# Patient Record
Sex: Female | Born: 1995
Health system: Southern US, Community
[De-identification: ages and names within clinical notes are randomized; demographics above are authoritative.]

## PROBLEM LIST (undated history)

## (undated) DIAGNOSIS — M249 Joint derangement, unspecified: Secondary | ICD-10-CM

## (undated) DIAGNOSIS — G43909 Migraine, unspecified, not intractable, without status migrainosus: Secondary | ICD-10-CM

## (undated) DIAGNOSIS — F419 Anxiety disorder, unspecified: Secondary | ICD-10-CM

## (undated) DIAGNOSIS — M797 Fibromyalgia: Secondary | ICD-10-CM

---
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
## 2016-02-13 DIAGNOSIS — H31011 Macula scars of posterior pole (postinflammatory) (post-traumatic), right eye: Secondary | ICD-10-CM | POA: Diagnosis not present

## 2016-02-27 DIAGNOSIS — J029 Acute pharyngitis, unspecified: Secondary | ICD-10-CM | POA: Diagnosis not present

## 2016-03-28 DIAGNOSIS — N926 Irregular menstruation, unspecified: Secondary | ICD-10-CM | POA: Diagnosis not present

## 2016-03-28 DIAGNOSIS — Z01419 Encounter for gynecological examination (general) (routine) without abnormal findings: Secondary | ICD-10-CM | POA: Diagnosis not present

## 2016-03-28 DIAGNOSIS — Z113 Encounter for screening for infections with a predominantly sexual mode of transmission: Secondary | ICD-10-CM | POA: Diagnosis not present

## 2016-03-28 DIAGNOSIS — Z6830 Body mass index (BMI) 30.0-30.9, adult: Secondary | ICD-10-CM | POA: Diagnosis not present

## 2016-04-10 DIAGNOSIS — R102 Pelvic and perineal pain: Secondary | ICD-10-CM | POA: Diagnosis not present

## 2016-04-10 DIAGNOSIS — N939 Abnormal uterine and vaginal bleeding, unspecified: Secondary | ICD-10-CM | POA: Diagnosis not present

## 2016-05-06 DIAGNOSIS — F411 Generalized anxiety disorder: Secondary | ICD-10-CM | POA: Diagnosis not present

## 2016-05-13 DIAGNOSIS — F411 Generalized anxiety disorder: Secondary | ICD-10-CM | POA: Diagnosis not present

## 2016-05-21 DIAGNOSIS — F411 Generalized anxiety disorder: Secondary | ICD-10-CM | POA: Diagnosis not present

## 2016-05-29 DIAGNOSIS — F411 Generalized anxiety disorder: Secondary | ICD-10-CM | POA: Diagnosis not present

## 2016-06-04 DIAGNOSIS — F411 Generalized anxiety disorder: Secondary | ICD-10-CM | POA: Diagnosis not present

## 2016-06-07 DIAGNOSIS — J310 Chronic rhinitis: Secondary | ICD-10-CM | POA: Diagnosis not present

## 2016-06-07 DIAGNOSIS — K219 Gastro-esophageal reflux disease without esophagitis: Secondary | ICD-10-CM | POA: Diagnosis not present

## 2016-06-07 DIAGNOSIS — R05 Cough: Secondary | ICD-10-CM | POA: Diagnosis not present

## 2016-06-11 DIAGNOSIS — F411 Generalized anxiety disorder: Secondary | ICD-10-CM | POA: Diagnosis not present

## 2016-06-17 DIAGNOSIS — F411 Generalized anxiety disorder: Secondary | ICD-10-CM | POA: Diagnosis not present

## 2016-06-24 DIAGNOSIS — F411 Generalized anxiety disorder: Secondary | ICD-10-CM | POA: Diagnosis not present

## 2016-07-02 DIAGNOSIS — F411 Generalized anxiety disorder: Secondary | ICD-10-CM | POA: Diagnosis not present

## 2016-07-03 DIAGNOSIS — F3281 Premenstrual dysphoric disorder: Secondary | ICD-10-CM | POA: Diagnosis not present

## 2016-07-03 DIAGNOSIS — F418 Other specified anxiety disorders: Secondary | ICD-10-CM | POA: Diagnosis not present

## 2016-07-08 DIAGNOSIS — F411 Generalized anxiety disorder: Secondary | ICD-10-CM | POA: Diagnosis not present

## 2016-07-15 DIAGNOSIS — F411 Generalized anxiety disorder: Secondary | ICD-10-CM | POA: Diagnosis not present

## 2016-07-22 DIAGNOSIS — F411 Generalized anxiety disorder: Secondary | ICD-10-CM | POA: Diagnosis not present

## 2016-07-29 DIAGNOSIS — F411 Generalized anxiety disorder: Secondary | ICD-10-CM | POA: Diagnosis not present

## 2016-08-05 DIAGNOSIS — F411 Generalized anxiety disorder: Secondary | ICD-10-CM | POA: Diagnosis not present

## 2016-08-12 DIAGNOSIS — F411 Generalized anxiety disorder: Secondary | ICD-10-CM | POA: Diagnosis not present

## 2016-08-14 DIAGNOSIS — F418 Other specified anxiety disorders: Secondary | ICD-10-CM | POA: Diagnosis not present

## 2016-08-19 DIAGNOSIS — F411 Generalized anxiety disorder: Secondary | ICD-10-CM | POA: Diagnosis not present

## 2016-08-28 DIAGNOSIS — K080 Exfoliation of teeth due to systemic causes: Secondary | ICD-10-CM | POA: Diagnosis not present

## 2016-09-02 DIAGNOSIS — F411 Generalized anxiety disorder: Secondary | ICD-10-CM | POA: Diagnosis not present

## 2016-09-16 DIAGNOSIS — F411 Generalized anxiety disorder: Secondary | ICD-10-CM | POA: Diagnosis not present

## 2016-09-23 DIAGNOSIS — F411 Generalized anxiety disorder: Secondary | ICD-10-CM | POA: Diagnosis not present

## 2016-09-30 DIAGNOSIS — F411 Generalized anxiety disorder: Secondary | ICD-10-CM | POA: Diagnosis not present

## 2016-10-07 DIAGNOSIS — F411 Generalized anxiety disorder: Secondary | ICD-10-CM | POA: Diagnosis not present

## 2016-10-14 DIAGNOSIS — F411 Generalized anxiety disorder: Secondary | ICD-10-CM | POA: Diagnosis not present

## 2016-10-25 DIAGNOSIS — F411 Generalized anxiety disorder: Secondary | ICD-10-CM | POA: Diagnosis not present

## 2016-11-04 DIAGNOSIS — F411 Generalized anxiety disorder: Secondary | ICD-10-CM | POA: Diagnosis not present

## 2016-11-11 DIAGNOSIS — F411 Generalized anxiety disorder: Secondary | ICD-10-CM | POA: Diagnosis not present

## 2016-11-18 DIAGNOSIS — F411 Generalized anxiety disorder: Secondary | ICD-10-CM | POA: Diagnosis not present

## 2016-11-26 DIAGNOSIS — F411 Generalized anxiety disorder: Secondary | ICD-10-CM | POA: Diagnosis not present

## 2016-12-02 DIAGNOSIS — F411 Generalized anxiety disorder: Secondary | ICD-10-CM | POA: Diagnosis not present

## 2016-12-09 DIAGNOSIS — F411 Generalized anxiety disorder: Secondary | ICD-10-CM | POA: Diagnosis not present

## 2016-12-16 DIAGNOSIS — F411 Generalized anxiety disorder: Secondary | ICD-10-CM | POA: Diagnosis not present

## 2016-12-23 DIAGNOSIS — F411 Generalized anxiety disorder: Secondary | ICD-10-CM | POA: Diagnosis not present

## 2016-12-30 DIAGNOSIS — F411 Generalized anxiety disorder: Secondary | ICD-10-CM | POA: Diagnosis not present

## 2017-01-13 DIAGNOSIS — F411 Generalized anxiety disorder: Secondary | ICD-10-CM | POA: Diagnosis not present

## 2017-01-20 DIAGNOSIS — F411 Generalized anxiety disorder: Secondary | ICD-10-CM | POA: Diagnosis not present

## 2017-01-27 DIAGNOSIS — F411 Generalized anxiety disorder: Secondary | ICD-10-CM | POA: Diagnosis not present

## 2017-02-03 DIAGNOSIS — F411 Generalized anxiety disorder: Secondary | ICD-10-CM | POA: Diagnosis not present

## 2017-02-17 DIAGNOSIS — F411 Generalized anxiety disorder: Secondary | ICD-10-CM | POA: Diagnosis not present

## 2017-02-24 DIAGNOSIS — F411 Generalized anxiety disorder: Secondary | ICD-10-CM | POA: Diagnosis not present

## 2017-03-10 DIAGNOSIS — F411 Generalized anxiety disorder: Secondary | ICD-10-CM | POA: Diagnosis not present

## 2017-03-17 DIAGNOSIS — F411 Generalized anxiety disorder: Secondary | ICD-10-CM | POA: Diagnosis not present

## 2017-03-31 DIAGNOSIS — F411 Generalized anxiety disorder: Secondary | ICD-10-CM | POA: Diagnosis not present

## 2017-04-07 DIAGNOSIS — F411 Generalized anxiety disorder: Secondary | ICD-10-CM | POA: Diagnosis not present

## 2017-04-09 DIAGNOSIS — K080 Exfoliation of teeth due to systemic causes: Secondary | ICD-10-CM | POA: Diagnosis not present

## 2017-04-28 DIAGNOSIS — F411 Generalized anxiety disorder: Secondary | ICD-10-CM | POA: Diagnosis not present

## 2017-05-01 DIAGNOSIS — J0100 Acute maxillary sinusitis, unspecified: Secondary | ICD-10-CM | POA: Diagnosis not present

## 2017-05-05 DIAGNOSIS — F411 Generalized anxiety disorder: Secondary | ICD-10-CM | POA: Diagnosis not present

## 2017-05-12 DIAGNOSIS — F411 Generalized anxiety disorder: Secondary | ICD-10-CM | POA: Diagnosis not present

## 2017-05-19 DIAGNOSIS — F411 Generalized anxiety disorder: Secondary | ICD-10-CM | POA: Diagnosis not present

## 2017-05-26 DIAGNOSIS — F411 Generalized anxiety disorder: Secondary | ICD-10-CM | POA: Diagnosis not present

## 2017-06-02 DIAGNOSIS — F411 Generalized anxiety disorder: Secondary | ICD-10-CM | POA: Diagnosis not present

## 2017-06-09 DIAGNOSIS — F411 Generalized anxiety disorder: Secondary | ICD-10-CM | POA: Diagnosis not present

## 2017-06-11 DIAGNOSIS — R05 Cough: Secondary | ICD-10-CM | POA: Diagnosis not present

## 2017-06-11 DIAGNOSIS — J310 Chronic rhinitis: Secondary | ICD-10-CM | POA: Diagnosis not present

## 2017-06-11 DIAGNOSIS — K219 Gastro-esophageal reflux disease without esophagitis: Secondary | ICD-10-CM | POA: Diagnosis not present

## 2017-06-16 DIAGNOSIS — F411 Generalized anxiety disorder: Secondary | ICD-10-CM | POA: Diagnosis not present

## 2017-06-23 DIAGNOSIS — F411 Generalized anxiety disorder: Secondary | ICD-10-CM | POA: Diagnosis not present

## 2017-07-02 DIAGNOSIS — F411 Generalized anxiety disorder: Secondary | ICD-10-CM | POA: Diagnosis not present

## 2017-07-14 DIAGNOSIS — F411 Generalized anxiety disorder: Secondary | ICD-10-CM | POA: Diagnosis not present

## 2017-07-21 DIAGNOSIS — F411 Generalized anxiety disorder: Secondary | ICD-10-CM | POA: Diagnosis not present

## 2017-07-28 DIAGNOSIS — F411 Generalized anxiety disorder: Secondary | ICD-10-CM | POA: Diagnosis not present

## 2017-07-31 DIAGNOSIS — Z01419 Encounter for gynecological examination (general) (routine) without abnormal findings: Secondary | ICD-10-CM | POA: Diagnosis not present

## 2017-07-31 DIAGNOSIS — Z6830 Body mass index (BMI) 30.0-30.9, adult: Secondary | ICD-10-CM | POA: Diagnosis not present

## 2017-07-31 DIAGNOSIS — Z113 Encounter for screening for infections with a predominantly sexual mode of transmission: Secondary | ICD-10-CM | POA: Diagnosis not present

## 2017-08-04 DIAGNOSIS — F411 Generalized anxiety disorder: Secondary | ICD-10-CM | POA: Diagnosis not present

## 2017-08-11 DIAGNOSIS — F411 Generalized anxiety disorder: Secondary | ICD-10-CM | POA: Diagnosis not present

## 2017-08-18 DIAGNOSIS — F411 Generalized anxiety disorder: Secondary | ICD-10-CM | POA: Diagnosis not present

## 2017-09-03 DIAGNOSIS — F411 Generalized anxiety disorder: Secondary | ICD-10-CM | POA: Diagnosis not present

## 2017-09-08 DIAGNOSIS — F411 Generalized anxiety disorder: Secondary | ICD-10-CM | POA: Diagnosis not present

## 2017-09-15 DIAGNOSIS — F411 Generalized anxiety disorder: Secondary | ICD-10-CM | POA: Diagnosis not present

## 2017-09-22 DIAGNOSIS — F411 Generalized anxiety disorder: Secondary | ICD-10-CM | POA: Diagnosis not present

## 2017-09-29 DIAGNOSIS — F411 Generalized anxiety disorder: Secondary | ICD-10-CM | POA: Diagnosis not present

## 2017-10-06 DIAGNOSIS — F411 Generalized anxiety disorder: Secondary | ICD-10-CM | POA: Diagnosis not present

## 2017-10-13 DIAGNOSIS — F411 Generalized anxiety disorder: Secondary | ICD-10-CM | POA: Diagnosis not present

## 2017-10-29 DIAGNOSIS — F411 Generalized anxiety disorder: Secondary | ICD-10-CM | POA: Diagnosis not present

## 2017-11-03 DIAGNOSIS — F411 Generalized anxiety disorder: Secondary | ICD-10-CM | POA: Diagnosis not present

## 2017-11-10 DIAGNOSIS — F411 Generalized anxiety disorder: Secondary | ICD-10-CM | POA: Diagnosis not present

## 2017-11-17 DIAGNOSIS — F411 Generalized anxiety disorder: Secondary | ICD-10-CM | POA: Diagnosis not present

## 2017-11-24 DIAGNOSIS — F411 Generalized anxiety disorder: Secondary | ICD-10-CM | POA: Diagnosis not present

## 2017-12-01 DIAGNOSIS — F411 Generalized anxiety disorder: Secondary | ICD-10-CM | POA: Diagnosis not present

## 2017-12-08 DIAGNOSIS — F411 Generalized anxiety disorder: Secondary | ICD-10-CM | POA: Diagnosis not present

## 2017-12-15 DIAGNOSIS — F411 Generalized anxiety disorder: Secondary | ICD-10-CM | POA: Diagnosis not present

## 2017-12-22 DIAGNOSIS — F411 Generalized anxiety disorder: Secondary | ICD-10-CM | POA: Diagnosis not present

## 2017-12-29 DIAGNOSIS — F411 Generalized anxiety disorder: Secondary | ICD-10-CM | POA: Diagnosis not present

## 2018-01-05 DIAGNOSIS — F411 Generalized anxiety disorder: Secondary | ICD-10-CM | POA: Diagnosis not present

## 2018-01-12 DIAGNOSIS — F411 Generalized anxiety disorder: Secondary | ICD-10-CM | POA: Diagnosis not present

## 2018-01-19 DIAGNOSIS — F411 Generalized anxiety disorder: Secondary | ICD-10-CM | POA: Diagnosis not present

## 2018-02-02 DIAGNOSIS — F411 Generalized anxiety disorder: Secondary | ICD-10-CM | POA: Diagnosis not present

## 2018-02-09 DIAGNOSIS — F411 Generalized anxiety disorder: Secondary | ICD-10-CM | POA: Diagnosis not present

## 2018-02-16 DIAGNOSIS — F411 Generalized anxiety disorder: Secondary | ICD-10-CM | POA: Diagnosis not present

## 2018-02-23 DIAGNOSIS — F411 Generalized anxiety disorder: Secondary | ICD-10-CM | POA: Diagnosis not present

## 2018-03-09 DIAGNOSIS — F411 Generalized anxiety disorder: Secondary | ICD-10-CM | POA: Diagnosis not present

## 2018-03-30 DIAGNOSIS — F411 Generalized anxiety disorder: Secondary | ICD-10-CM | POA: Diagnosis not present

## 2018-04-06 DIAGNOSIS — F411 Generalized anxiety disorder: Secondary | ICD-10-CM | POA: Diagnosis not present

## 2018-04-13 DIAGNOSIS — F411 Generalized anxiety disorder: Secondary | ICD-10-CM | POA: Diagnosis not present

## 2018-04-20 DIAGNOSIS — F411 Generalized anxiety disorder: Secondary | ICD-10-CM | POA: Diagnosis not present

## 2018-04-27 DIAGNOSIS — F411 Generalized anxiety disorder: Secondary | ICD-10-CM | POA: Diagnosis not present

## 2018-05-11 DIAGNOSIS — F411 Generalized anxiety disorder: Secondary | ICD-10-CM | POA: Diagnosis not present

## 2018-05-18 DIAGNOSIS — F411 Generalized anxiety disorder: Secondary | ICD-10-CM | POA: Diagnosis not present

## 2018-05-25 DIAGNOSIS — F411 Generalized anxiety disorder: Secondary | ICD-10-CM | POA: Diagnosis not present

## 2018-06-08 DIAGNOSIS — F411 Generalized anxiety disorder: Secondary | ICD-10-CM | POA: Diagnosis not present

## 2018-06-10 DIAGNOSIS — D649 Anemia, unspecified: Secondary | ICD-10-CM | POA: Diagnosis not present

## 2018-06-10 DIAGNOSIS — N760 Acute vaginitis: Secondary | ICD-10-CM | POA: Diagnosis not present

## 2018-06-10 DIAGNOSIS — Z113 Encounter for screening for infections with a predominantly sexual mode of transmission: Secondary | ICD-10-CM | POA: Diagnosis not present

## 2018-06-16 DIAGNOSIS — F411 Generalized anxiety disorder: Secondary | ICD-10-CM | POA: Diagnosis not present

## 2018-07-02 DIAGNOSIS — F411 Generalized anxiety disorder: Secondary | ICD-10-CM | POA: Diagnosis not present

## 2018-07-13 DIAGNOSIS — F411 Generalized anxiety disorder: Secondary | ICD-10-CM | POA: Diagnosis not present

## 2018-07-30 DIAGNOSIS — F411 Generalized anxiety disorder: Secondary | ICD-10-CM | POA: Diagnosis not present

## 2018-08-17 DIAGNOSIS — N760 Acute vaginitis: Secondary | ICD-10-CM | POA: Diagnosis not present

## 2018-08-17 DIAGNOSIS — Z113 Encounter for screening for infections with a predominantly sexual mode of transmission: Secondary | ICD-10-CM | POA: Diagnosis not present

## 2018-09-08 DIAGNOSIS — Z113 Encounter for screening for infections with a predominantly sexual mode of transmission: Secondary | ICD-10-CM | POA: Diagnosis not present

## 2018-09-08 DIAGNOSIS — Z6824 Body mass index (BMI) 24.0-24.9, adult: Secondary | ICD-10-CM | POA: Diagnosis not present

## 2018-09-08 DIAGNOSIS — Z0000 Encounter for general adult medical examination without abnormal findings: Secondary | ICD-10-CM | POA: Diagnosis not present

## 2018-09-08 DIAGNOSIS — R87610 Atypical squamous cells of undetermined significance on cytologic smear of cervix (ASC-US): Secondary | ICD-10-CM | POA: Diagnosis not present

## 2018-09-08 DIAGNOSIS — Z1329 Encounter for screening for other suspected endocrine disorder: Secondary | ICD-10-CM | POA: Diagnosis not present

## 2018-09-08 DIAGNOSIS — Z01419 Encounter for gynecological examination (general) (routine) without abnormal findings: Secondary | ICD-10-CM | POA: Diagnosis not present

## 2018-10-19 DIAGNOSIS — M5489 Other dorsalgia: Secondary | ICD-10-CM | POA: Diagnosis not present

## 2018-10-19 DIAGNOSIS — S322XXA Fracture of coccyx, initial encounter for closed fracture: Secondary | ICD-10-CM | POA: Diagnosis not present

## 2018-11-10 DIAGNOSIS — S322XXD Fracture of coccyx, subsequent encounter for fracture with routine healing: Secondary | ICD-10-CM | POA: Diagnosis not present

## 2019-01-28 DIAGNOSIS — F411 Generalized anxiety disorder: Secondary | ICD-10-CM | POA: Diagnosis not present

## 2019-02-11 DIAGNOSIS — F411 Generalized anxiety disorder: Secondary | ICD-10-CM | POA: Diagnosis not present

## 2019-02-25 DIAGNOSIS — F411 Generalized anxiety disorder: Secondary | ICD-10-CM | POA: Diagnosis not present

## 2019-03-19 DIAGNOSIS — F411 Generalized anxiety disorder: Secondary | ICD-10-CM | POA: Diagnosis not present

## 2019-03-25 DIAGNOSIS — F411 Generalized anxiety disorder: Secondary | ICD-10-CM | POA: Diagnosis not present

## 2019-04-01 DIAGNOSIS — Z03818 Encounter for observation for suspected exposure to other biological agents ruled out: Secondary | ICD-10-CM | POA: Diagnosis not present

## 2019-04-08 DIAGNOSIS — F411 Generalized anxiety disorder: Secondary | ICD-10-CM | POA: Diagnosis not present

## 2019-04-22 DIAGNOSIS — F411 Generalized anxiety disorder: Secondary | ICD-10-CM | POA: Diagnosis not present

## 2019-05-06 DIAGNOSIS — F411 Generalized anxiety disorder: Secondary | ICD-10-CM | POA: Diagnosis not present

## 2019-05-19 DIAGNOSIS — F411 Generalized anxiety disorder: Secondary | ICD-10-CM | POA: Diagnosis not present

## 2019-06-02 DIAGNOSIS — F411 Generalized anxiety disorder: Secondary | ICD-10-CM | POA: Diagnosis not present

## 2019-06-16 DIAGNOSIS — F411 Generalized anxiety disorder: Secondary | ICD-10-CM | POA: Diagnosis not present

## 2019-06-21 DIAGNOSIS — H9193 Unspecified hearing loss, bilateral: Secondary | ICD-10-CM | POA: Diagnosis not present

## 2019-06-30 DIAGNOSIS — F411 Generalized anxiety disorder: Secondary | ICD-10-CM | POA: Diagnosis not present

## 2019-07-08 DIAGNOSIS — Z7289 Other problems related to lifestyle: Secondary | ICD-10-CM | POA: Diagnosis not present

## 2019-07-08 DIAGNOSIS — H93293 Other abnormal auditory perceptions, bilateral: Secondary | ICD-10-CM | POA: Diagnosis not present

## 2019-07-08 DIAGNOSIS — F1290 Cannabis use, unspecified, uncomplicated: Secondary | ICD-10-CM | POA: Diagnosis not present

## 2019-07-14 DIAGNOSIS — F411 Generalized anxiety disorder: Secondary | ICD-10-CM | POA: Diagnosis not present

## 2019-07-21 DIAGNOSIS — F411 Generalized anxiety disorder: Secondary | ICD-10-CM | POA: Diagnosis not present

## 2019-08-04 DIAGNOSIS — F411 Generalized anxiety disorder: Secondary | ICD-10-CM | POA: Diagnosis not present

## 2019-08-19 DIAGNOSIS — F411 Generalized anxiety disorder: Secondary | ICD-10-CM | POA: Diagnosis not present

## 2019-09-01 DIAGNOSIS — F411 Generalized anxiety disorder: Secondary | ICD-10-CM | POA: Diagnosis not present

## 2019-09-07 DIAGNOSIS — Z20828 Contact with and (suspected) exposure to other viral communicable diseases: Secondary | ICD-10-CM | POA: Diagnosis not present

## 2019-09-13 DIAGNOSIS — Z124 Encounter for screening for malignant neoplasm of cervix: Secondary | ICD-10-CM | POA: Diagnosis not present

## 2019-09-13 DIAGNOSIS — Z131 Encounter for screening for diabetes mellitus: Secondary | ICD-10-CM | POA: Diagnosis not present

## 2019-09-13 DIAGNOSIS — Z13220 Encounter for screening for lipoid disorders: Secondary | ICD-10-CM | POA: Diagnosis not present

## 2019-09-13 DIAGNOSIS — Z0000 Encounter for general adult medical examination without abnormal findings: Secondary | ICD-10-CM | POA: Diagnosis not present

## 2019-09-15 DIAGNOSIS — F411 Generalized anxiety disorder: Secondary | ICD-10-CM | POA: Diagnosis not present

## 2019-09-20 ENCOUNTER — Encounter (HOSPITAL_COMMUNITY): Payer: Self-pay | Admitting: Emergency Medicine

## 2019-09-20 ENCOUNTER — Emergency Department (HOSPITAL_COMMUNITY)
Admission: EM | Admit: 2019-09-20 | Discharge: 2019-09-20 | Disposition: A | Discharge status: Home / Self Care | Payer: Federal, State, Local not specified - PPO | Attending: Emergency Medicine | Admitting: Emergency Medicine

## 2019-09-20 ENCOUNTER — Emergency Department (HOSPITAL_COMMUNITY): Payer: Federal, State, Local not specified - PPO

## 2019-09-20 ENCOUNTER — Other Ambulatory Visit: Payer: Self-pay

## 2019-09-20 DIAGNOSIS — K5904 Chronic idiopathic constipation: Secondary | ICD-10-CM | POA: Diagnosis not present

## 2019-09-20 DIAGNOSIS — N83209 Unspecified ovarian cyst, unspecified side: Secondary | ICD-10-CM

## 2019-09-20 DIAGNOSIS — A749 Chlamydial infection, unspecified: Secondary | ICD-10-CM | POA: Insufficient documentation

## 2019-09-20 DIAGNOSIS — N83201 Unspecified ovarian cyst, right side: Secondary | ICD-10-CM | POA: Insufficient documentation

## 2019-09-20 DIAGNOSIS — N83291 Other ovarian cyst, right side: Secondary | ICD-10-CM | POA: Diagnosis not present

## 2019-09-20 DIAGNOSIS — R1031 Right lower quadrant pain: Secondary | ICD-10-CM | POA: Diagnosis not present

## 2019-09-20 DIAGNOSIS — B9689 Other specified bacterial agents as the cause of diseases classified elsewhere: Secondary | ICD-10-CM | POA: Diagnosis not present

## 2019-09-20 DIAGNOSIS — N760 Acute vaginitis: Secondary | ICD-10-CM | POA: Diagnosis not present

## 2019-09-20 DIAGNOSIS — Z79899 Other long term (current) drug therapy: Secondary | ICD-10-CM | POA: Insufficient documentation

## 2019-09-20 LAB — CBC
HCT: 42.1 % (ref 36.0–46.0)
Hemoglobin: 14.5 g/dL (ref 12.0–15.0)
MCH: 31.9 pg (ref 26.0–34.0)
MCHC: 34.4 g/dL (ref 30.0–36.0)
MCV: 92.5 fL (ref 80.0–100.0)
Platelets: 241 10*3/uL (ref 150–400)
RBC: 4.55 MIL/uL (ref 3.87–5.11)
RDW: 12.1 % (ref 11.5–15.5)
WBC: 11.9 10*3/uL — ABNORMAL HIGH (ref 4.0–10.5)
nRBC: 0 % (ref 0.0–0.2)

## 2019-09-20 LAB — URINALYSIS, ROUTINE W REFLEX MICROSCOPIC
Bilirubin Urine: NEGATIVE
Glucose, UA: NEGATIVE mg/dL
Hgb urine dipstick: NEGATIVE
Ketones, ur: NEGATIVE mg/dL
Leukocytes,Ua: NEGATIVE
Nitrite: NEGATIVE
Protein, ur: NEGATIVE mg/dL
Specific Gravity, Urine: 1.015 (ref 1.005–1.030)
pH: 7 (ref 5.0–8.0)

## 2019-09-20 LAB — WET PREP, GENITAL
Sperm: NONE SEEN
Trich, Wet Prep: NONE SEEN
Yeast Wet Prep HPF POC: NONE SEEN

## 2019-09-20 LAB — COMPREHENSIVE METABOLIC PANEL
ALT: 11 U/L (ref 0–44)
AST: 13 U/L — ABNORMAL LOW (ref 15–41)
Albumin: 4.4 g/dL (ref 3.5–5.0)
Alkaline Phosphatase: 43 U/L (ref 38–126)
Anion gap: 8 (ref 5–15)
BUN: 8 mg/dL (ref 6–20)
CO2: 24 mmol/L (ref 22–32)
Calcium: 9.5 mg/dL (ref 8.9–10.3)
Chloride: 105 mmol/L (ref 98–111)
Creatinine, Ser: 0.84 mg/dL (ref 0.44–1.00)
GFR calc Af Amer: >60 mL/min (ref >60)
GFR calc non Af Amer: >60 mL/min (ref >60)
Glucose, Bld: 104 mg/dL — ABNORMAL HIGH (ref 70–99)
Potassium: 3.6 mmol/L (ref 3.5–5.1)
Sodium: 137 mmol/L (ref 135–145)
Total Bilirubin: 0.7 mg/dL (ref 0.3–1.2)
Total Protein: 7.5 g/dL (ref 6.5–8.1)

## 2019-09-20 LAB — I-STAT BETA HCG BLOOD, ED (MC, WL, AP ONLY): I-stat hCG, quantitative: <5 m[IU]/mL (ref <5)

## 2019-09-20 LAB — LIPASE, BLOOD: Lipase: 29 U/L (ref 11–51)

## 2019-09-20 MED ORDER — NAPROXEN 500 MG PO TABS: 500.0000 mg | ORAL_TABLET | Freq: Three times a day (TID) | ORAL | 0 refills | Status: AC

## 2019-09-20 MED ORDER — SODIUM CHLORIDE 0.9 % IV SOLN
Freq: Once | INTRAVENOUS | Status: AC
  Administered 2019-09-20: 18:00:00 via INTRAVENOUS

## 2019-09-20 MED ORDER — ONDANSETRON HCL 4 MG/2ML IJ SOLN
4.0000 mg | Freq: Once | INTRAMUSCULAR | Status: AC
  Administered 2019-09-20: 17:00:00 4 mg via INTRAVENOUS
  Filled 2019-09-20: qty 2

## 2019-09-20 MED ORDER — SODIUM CHLORIDE 0.9% FLUSH
3.0000 mL | Freq: Once | INTRAVENOUS | Status: AC
  Administered 2019-09-20: 3 mL via INTRAVENOUS

## 2019-09-20 MED ORDER — IOHEXOL 300 MG/ML  SOLN
100.0000 mL | Freq: Once | INTRAMUSCULAR | Status: AC | PRN
  Administered 2019-09-20: 100 mL via INTRAVENOUS

## 2019-09-20 MED ORDER — ONDANSETRON HCL 4 MG/2ML IJ SOLN
4.0000 mg | Freq: Once | INTRAMUSCULAR | Status: AC
  Administered 2019-09-20: 4 mg via INTRAVENOUS
  Filled 2019-09-20: qty 2

## 2019-09-20 MED ORDER — METRONIDAZOLE 500 MG PO TABS: 500.0000 mg | ORAL_TABLET | Freq: Two times a day (BID) | ORAL | 0 refills | Status: DC

## 2019-09-20 MED ORDER — MORPHINE SULFATE (PF) 4 MG/ML IV SOLN
4.0000 mg | Freq: Once | INTRAVENOUS | Status: AC
  Administered 2019-09-20: 4 mg via INTRAVENOUS
  Filled 2019-09-20: qty 1

## 2019-09-20 MED ORDER — ONDANSETRON 4 MG PO TBDP: 4.0000 mg | ORAL_TABLET | Freq: Three times a day (TID) | ORAL | 0 refills | Status: DC | PRN

## 2019-09-20 MED ORDER — KETOROLAC TROMETHAMINE 30 MG/ML IJ SOLN
30.0000 mg | Freq: Once | INTRAMUSCULAR | Status: AC
  Administered 2019-09-20: 30 mg via INTRAVENOUS
  Filled 2019-09-20: qty 1

## 2019-09-20 MED ORDER — OXYCODONE-ACETAMINOPHEN 5-325 MG PO TABS
1.0000 | ORAL_TABLET | Freq: Once | ORAL | Status: AC
  Administered 2019-09-20: 1 via ORAL
  Filled 2019-09-20: qty 1

## 2019-09-20 NOTE — Discharge Instructions (Signed)
You were seen in the ER for abdominal pain  Pelvic exam was done and swabs were collected to check for pelvic infection.  Swabs showed bacterial vaginosis which can cause odor and discharge, take metronidazole for this.  This medicine can cause nausea, so use ondansetron as needed for nausea. Do not drink alcohol with this medicine as it will cause severe nausea, vomiting, abdominal pain. Remaining testing for gonorrhea, chlamydia is pending.    CT showed diverticulosis but no active diverticulitis.  It also showed a small 2.6 cm right ovary cyst with fluid around it likely from rupture.  This is likely causing your pain.    Alternate naproxen 500 mg every 8 hours for pain.  Can add [phone] mg acetaminophen every 6-8 hours for more pain control.  Pain due to inflammation will ease off in the next few days.  Miralax or stool softener, increase fiber and oral hydration for constipation that can also contribute to lower abdominal pain  Return for worsening pain despite medicines, abdominal bloating or swelling, heavy vaginal bleeding or discharge, fever

## 2019-09-20 NOTE — ED Triage Notes (Signed)
Pt reports RLQ pain x3 days, worse with radiation, denies vomiting or diarrhea, states she has been constipated. Also reports pain radiates down R leg. Pt still has her appendix.

## 2019-09-20 NOTE — ED Notes (Signed)
Patient verbalizes understanding of discharge instructions. Opportunity for questioning and answers were provided. Armband removed by staff, pt discharged from ED ambulatory to home.  

## 2019-09-20 NOTE — ED Provider Notes (Signed)
[facility]   CSN: [id number] Arrival date & time: 09/20/19  1439     History   Chief Complaint Chief Complaint  Patient presents with   Abdominal Pain    HPI Denise Howard is a 23 y.o. female with history of constipation, uterine fibroids presents to the ER for evaluation of abdominal pain.  Onset a few days ago that acutely worsened last night.  Described as lower, cramping, intermittent.  Pain radiates from RLQ across lower abdomen and low back. Initially thought it was ovulation pain or constipation because in the past she has had premenstrual cramping and issues with constipation that have caused similar pain.  She took Midol without significant relief.  Pain is worse with walking, certain position changes, eating.  When the pain is severe she feels pain radiating down the front of both legs R worse than L like her legs are going numb.  Yesterday she felt the pain was severe enough that she almost came to the ER but had a bowel movement and felt almost completely better, she felt well for the next few hours but this morning when she went to work the pain began around noon.  She was not able to eat.  Her last BM was yesterday morning, still passing gas now.  Has mild associated nausea but states she is always had morning time nausea her entire life states she is not a morning person.  Has noticed slight change in odor in her vaginal discharge.  She is sexually active with female partners only with inconsistent condom use.  Her last menstrual period was November 7.  A couple of months ago she was sexually active with a female partner and took Plan B which is caused irregular vaginal spotting.  She had scant amount of brown-red vaginal bleeding after her last menses and after having sex recently.  No history of pelvic infections or STDs.  She is not concerned about an STD currently.  Denies fever, vomiting, dysuria, hematuria.  States in the  past she used to have a lot of lower abdominal pain and had to go to the ER but states they were unable to determine the cause.  No history of abdominal surgeries.  She does not use birth control.  No history of kidney stones.    HPI  Past Medical History:  Diagnosis Date   Anxiety    Fibromyalgia    Hypermobile joints    Migraines     There are no active problems to display for this patient.   History reviewed. No pertinent surgical history.   OB History   No obstetric history on file.      Home Medications    Prior to Admission medications   Medication Sig Start Date End Date Taking? Authorizing Provider  polyethylene glycol (MIRALAX / GLYCOLAX) 17 g packet Take 17 g by mouth daily.   Yes [provider]  ALPRAZolam Prudy Feeler(XANAX) 0.5 MG tablet Take 0.5 mg by mouth daily as needed for anxiety.  09/13/19   [provider]  hydrOXYzine (ATARAX/VISTARIL) 25 MG tablet Take 25 mg by mouth as needed for anxiety.     [provider]  metroNIDAZOLE (FLAGYL) 500 MG tablet Take 1 tablet (500 mg total) by mouth 2 (two) times daily. 09/20/19   Liberty HandyGibbons, Venezia Sargeant J, PA-C  naproxen (NAPROSYN) 500 MG tablet Take 1 tablet (500 mg total) by mouth 3 (three) times daily with meals for 7 days. 09/20/19 09/27/19  Liberty Handy, PA-C  ondansetron (ZOFRAN ODT) 4 MG disintegrating tablet Take 1 tablet (4 mg total) by mouth every 8 (eight) hours as needed for nausea or vomiting. 09/20/19   Liberty Handy, PA-C    Family History No family history on file.  Social History Social History   Tobacco Use   Smoking status: Never Smoker  Substance Use Topics   Alcohol use: No   Drug use: No     Allergies   Patient has no known allergies.   Review of Systems Review of Systems  Gastrointestinal: Positive for abdominal pain, constipation and nausea (chronic).  Genitourinary:       Vaginal odor  Musculoskeletal: Positive for back pain.  All other systems  reviewed and are negative.    Physical Exam Updated Vital Signs BP 103/73    Pulse 92    Temp 98.4 F (36.9 C) (Oral)    Resp 16    LMP 09/04/2019 (Exact Date)    SpO2 100%   Physical Exam Vitals signs and nursing note reviewed.  Constitutional:      Appearance: She is well-developed.     Comments: Non toxic in NAD  HENT:     Head: Normocephalic and atraumatic.     Nose: Nose normal.  Eyes:     Conjunctiva/sclera: Conjunctivae normal.  Neck:     Musculoskeletal: Normal range of motion.  Cardiovascular:     Rate and Rhythm: Normal rate and regular rhythm.  Pulmonary:     Effort: Pulmonary effort is normal.     Breath sounds: Normal breath sounds.  Abdominal:     General: Bowel sounds are normal.     Palpations: Abdomen is soft.     Tenderness: There is abdominal tenderness in the right lower quadrant, suprapubic area and left lower quadrant. Positive signs include McBurney's sign and psoas sign.     Comments: Diffuse lower abdominal pain worse in RLQ and LLQ.  Some, milder suprapubic tenderness.  Negative obturator signs.  No G/R/R. No CVA tenderness. Negative Murphy's. Active BS to lower quadrants. Laying flat and walking standing straight up makes pain worse.   Genitourinary:    Vagina: Vaginal discharge and tenderness present.     Comments: Exam performed with EMT at bedside for assistance. External genitalia without lesions.  No groin lymphadenopathy.  Vaginal mucosa and cervix pink without lesions, friability, polyps, bleeding. Cervical os visualized, closed.  Moderate amount white discharge in vaginal vault noted.  No CMT.  Nonpalpable, nontender adnexa.  Tenderness with palpation of LLQ and RLQ. Perianal skin normal without lesions.  Musculoskeletal: Normal range of motion.  Skin:    General: Skin is warm and dry.     Capillary Refill: Capillary refill takes less than 2 seconds.  Neurological:     Mental Status: She is alert.  Psychiatric:        Behavior: Behavior  normal.      ED Treatments / Results  Labs (all labs ordered are listed, but only abnormal results are displayed) Labs Reviewed  WET PREP, GENITAL - Abnormal; Notable for the following components:      Result Value   Clue Cells Wet Prep HPF POC PRESENT (*)    WBC, Wet Prep HPF POC MANY (*)    All other components within normal limits  COMPREHENSIVE METABOLIC PANEL - Abnormal; Notable for the following components:   Glucose, Bld 104 (*)    AST 13 (*)    All other components within normal limits  CBC - Abnormal; Notable for the following components:   WBC 11.9 (*)    All other components within normal limits  URINE CULTURE  LIPASE, BLOOD  URINALYSIS, ROUTINE W REFLEX MICROSCOPIC  I-STAT BETA HCG BLOOD, ED ([facility], [facility], [facility] ONLY)  GC/CHLAMYDIA PROBE AMP ([place]) NOT AT [facility]    EKG None  Radiology Ct Abdomen Pelvis W Contrast  Result Date: 09/20/2019 CLINICAL DATA:  Right lower quadrant pain for several days EXAM: CT ABDOMEN AND PELVIS WITH CONTRAST TECHNIQUE: Multidetector CT imaging of the abdomen and pelvis was performed using the standard protocol following bolus administration of intravenous contrast. CONTRAST:  OMNIPAQUE IOHEXOL 300 MG/ML  SOLN COMPARISON:  01/12/2013 FINDINGS: Lower chest: No acute abnormality. Hepatobiliary: No focal liver abnormality is seen. No gallstones, gallbladder wall thickening, or biliary dilatation. Pancreas: Unremarkable. No pancreatic ductal dilatation or surrounding inflammatory changes. Spleen: Normal in size without focal abnormality. Adrenals/Urinary Tract: Adrenal glands are within normal limits. Kidneys are well visualized bilaterally. Normal enhancement pattern is seen. No renal calculi or obstructive changes are noted. Bladder is partially distended. Stomach/Bowel: Scattered diverticular change of the colon is seen. No findings to suggest diverticulitis are noted. The appendix is well visualized and within normal limits. No inflammatory  changes are seen. No small bowel obstructive changes are seen. The stomach is within normal limits. Vascular/Lymphatic: No significant vascular findings are present. No enlarged abdominal or pelvic lymph nodes. Circumaortic left renal vein is noted. Reproductive: 2.6 cm right ovarian cyst is noted. No uterine abnormality is seen. Other: Mild free fluid is noted which may be related to a ruptured cyst. Musculoskeletal: No acute or significant osseous findings. IMPRESSION: Mild diverticulosis without diverticulitis. Normal-appearing appendix. 2.6 cm right ovarian cyst with evidence of mild free fluid. A recently ruptured cyst may account for these changes. Electronically Signed   By: Alcide Clever M.D.   On: 09/20/2019 20:46    Procedures Procedures (including critical care time)  Medications Ordered in ED Medications  sodium chloride flush (NS) 0.9 % injection 3 mL (3 mLs Intravenous Given 09/20/19 1722)  ondansetron (ZOFRAN) injection 4 mg (4 mg Intravenous Given 09/20/19 1659)  0.9 %  sodium chloride infusion ( Intravenous New Bag/Given 09/20/19 1810)  morphine 4 MG/ML injection 4 mg (4 mg Intravenous Given 09/20/19 1700)  iohexol (OMNIPAQUE) 300 MG/ML solution 100 mL (100 mLs Intravenous Contrast Given 09/20/19 1924)  ketorolac (TORADOL) 30 MG/ML injection 30 mg (30 mg Intravenous Given 09/20/19 2155)  oxyCODONE-acetaminophen (PERCOCET/ROXICET) 5-325 MG per tablet 1 tablet (1 tablet Oral Given 09/20/19 2158)  ondansetron (ZOFRAN) injection 4 mg (4 mg Intravenous Given 09/20/19 2155)     Initial Impression / Assessment and Plan / ED Course  I have reviewed the triage vital signs and the nursing notes.  Pertinent labs & imaging results that were available during my care of the patient were reviewed by me and considered in my medical decision making (see chart for details).      EMR reviewed to obtain pertinent PMH.   Differential diagnosis is broad for this patient.  She is sexually active  with inconsistent condom use, takes Plan B and is having intermittent vaginal spotting, vaginal odor.  STD, PID is on differential.  No fever, chills or significant leukocytosis and TOA is less likely.  We will plan for a pelvic exam.  She has no UTI symptoms but has lower abdominal pain, will obtain a UA.  GI process like constipation is highly likely as she has had  issues with this and has had recent decreased BMs, given location however appendicitis is also a possibility.  I doubt SBO, diverticulitis.  Your work-up initiated in triage is essentially reassuring, leukocytosis WC 11.9.  LFTs, lipase normal.  Negative pregnancy test which makes ectopic pregnancy unlikely.  UA pending.  Pelvic pending.  IVF, morphine, Zofran.  2226:  Pelvic as above, no CMT. No adnexal fullness or tenderness but tenderness with abdominal palpation RLQ/LLQ, moderate discharge.  Proceeded with CT given overall reassuring pelvic exam.  CTAP shows right likely ruptured ovarian cyst, normal appendix, diverticulosis.  +Clue cells on wet prep.   Pt re-evaluated, continued pain but milder.  IV toradol, percocet, zofran.   Discussed findings with patient and mother.    Will dc with naproxen, tylenol, flagyl and zofran for BV and ruptured ovarian cyst.  Recommended daily bowel regimen/miralax for chronic constipation.  Return precautions given. They are agreeable with tx and disposition.  Final Clinical Impressions(s) / ED Diagnoses   Final diagnoses:  Ovarian cyst rupture  Bacterial vaginosis  Chronic idiopathic constipation    ED Discharge Orders         Ordered    metroNIDAZOLE (FLAGYL) 500 MG tablet  2 times daily     09/20/19 2217    ondansetron (ZOFRAN ODT) 4 MG disintegrating tablet  Every 8 hours PRN     09/20/19 2217    naproxen (NAPROSYN) 500 MG tablet  3 times daily with meals     09/20/19 2217           Kinnie Feil, PA-C 09/20/19 2229    Little, Wenda Overland, MD 09/21/19 [id number]

## 2019-09-21 LAB — URINE CULTURE: Culture: <10000 — AB

## 2019-09-22 ENCOUNTER — Telehealth: Payer: Self-pay | Admitting: Student

## 2019-09-22 DIAGNOSIS — A749 Chlamydial infection, unspecified: Secondary | ICD-10-CM

## 2019-09-22 LAB — GC/CHLAMYDIA PROBE AMP (~~LOC~~) NOT AT ARMC
Chlamydia: POSITIVE — AB
Neisseria Gonorrhea: NEGATIVE

## 2019-09-22 MED ORDER — AZITHROMYCIN 500 MG PO TABS: 1000.0000 mg | ORAL_TABLET | Freq: Once | ORAL | 0 refills | Status: AC

## 2019-09-22 NOTE — Telephone Encounter (Addendum)
Denise Howard tested positive for  Chlamydia. Patient was called by RN and allergies and pharmacy confirmed. Rx sent to pharmacy of choice.   Jorje Guild, NP 09/22/2019 1:37 PM       ----- Message from Bjorn Loser, RN sent at 09/22/2019 12:24 PM EST ----- This patient tested positive for :  Chlamydia  She"has NKDA",I have informed the patient of her results and confirmed her pharmacy is correct in her chart. Please send Rx.   Thank you,   Bjorn Loser, RN   Results faxed to [facility] Department.

## 2019-09-22 NOTE — Telephone Encounter (Signed)
-----   Message from Bjorn Loser, RN sent at 09/22/2019 12:24 PM EST ----- This patient tested positive for :  Chlamydia  She"has NKDA",I have informed the patient of her results and confirmed her pharmacy is correct in her chart. Please send Rx.   Thank you,   Bjorn Loser, RN   Results faxed to [facility] Department.

## 2019-09-29 DIAGNOSIS — F411 Generalized anxiety disorder: Secondary | ICD-10-CM | POA: Diagnosis not present

## 2019-10-05 DIAGNOSIS — R102 Pelvic and perineal pain: Secondary | ICD-10-CM | POA: Diagnosis not present

## 2019-10-05 DIAGNOSIS — A749 Chlamydial infection, unspecified: Secondary | ICD-10-CM | POA: Diagnosis not present

## 2019-10-05 DIAGNOSIS — N83209 Unspecified ovarian cyst, unspecified side: Secondary | ICD-10-CM | POA: Diagnosis not present

## 2019-10-13 DIAGNOSIS — F411 Generalized anxiety disorder: Secondary | ICD-10-CM | POA: Diagnosis not present

## 2019-10-27 DIAGNOSIS — F411 Generalized anxiety disorder: Secondary | ICD-10-CM | POA: Diagnosis not present

## 2019-11-02 DIAGNOSIS — N83209 Unspecified ovarian cyst, unspecified side: Secondary | ICD-10-CM | POA: Diagnosis not present

## 2019-11-24 DIAGNOSIS — F411 Generalized anxiety disorder: Secondary | ICD-10-CM | POA: Diagnosis not present

## 2019-12-01 DIAGNOSIS — F411 Generalized anxiety disorder: Secondary | ICD-10-CM | POA: Diagnosis not present

## 2019-12-08 DIAGNOSIS — F411 Generalized anxiety disorder: Secondary | ICD-10-CM | POA: Diagnosis not present

## 2019-12-22 DIAGNOSIS — F411 Generalized anxiety disorder: Secondary | ICD-10-CM | POA: Diagnosis not present

## 2020-01-05 DIAGNOSIS — F411 Generalized anxiety disorder: Secondary | ICD-10-CM | POA: Diagnosis not present

## 2020-01-19 DIAGNOSIS — F411 Generalized anxiety disorder: Secondary | ICD-10-CM | POA: Diagnosis not present

## 2020-02-02 DIAGNOSIS — F411 Generalized anxiety disorder: Secondary | ICD-10-CM | POA: Diagnosis not present

## 2020-02-09 DIAGNOSIS — F411 Generalized anxiety disorder: Secondary | ICD-10-CM | POA: Diagnosis not present

## 2020-02-16 DIAGNOSIS — F411 Generalized anxiety disorder: Secondary | ICD-10-CM | POA: Diagnosis not present

## 2020-02-23 DIAGNOSIS — F411 Generalized anxiety disorder: Secondary | ICD-10-CM | POA: Diagnosis not present

## 2020-03-01 DIAGNOSIS — F411 Generalized anxiety disorder: Secondary | ICD-10-CM | POA: Diagnosis not present

## 2020-03-08 DIAGNOSIS — F418 Other specified anxiety disorders: Secondary | ICD-10-CM | POA: Diagnosis not present

## 2020-03-08 DIAGNOSIS — K589 Irritable bowel syndrome without diarrhea: Secondary | ICD-10-CM | POA: Diagnosis not present

## 2020-03-15 DIAGNOSIS — F411 Generalized anxiety disorder: Secondary | ICD-10-CM | POA: Diagnosis not present

## 2020-03-29 DIAGNOSIS — F411 Generalized anxiety disorder: Secondary | ICD-10-CM | POA: Diagnosis not present

## 2020-04-12 DIAGNOSIS — F411 Generalized anxiety disorder: Secondary | ICD-10-CM | POA: Diagnosis not present

## 2020-04-16 IMAGING — CT CT ABD-PELV W/ CM
2 of 4 series · 16 of 46 positions shown, 18 images · IV contrast (omnipaque)
Comparison: 01/12/2013

CLINICAL DATA: Right lower quadrant pain for several days

EXAM:
CT ABDOMEN AND PELVIS WITH CONTRAST
TECHNIQUE: Multidetector CT imaging of the abdomen and pelvis was performed
using the standard protocol following bolus administration of
intravenous contrast.
CONTRAST:  100mL OMNIPAQUE IOHEXOL 300 MG/ML  SOLN

[Series 3: a/p w/ 5mm · axial · 0.93mm/px · z∈[-668,-242]mm · 13 of 93 slices shown, 15 images]
[im 4/93  soft-tissue]
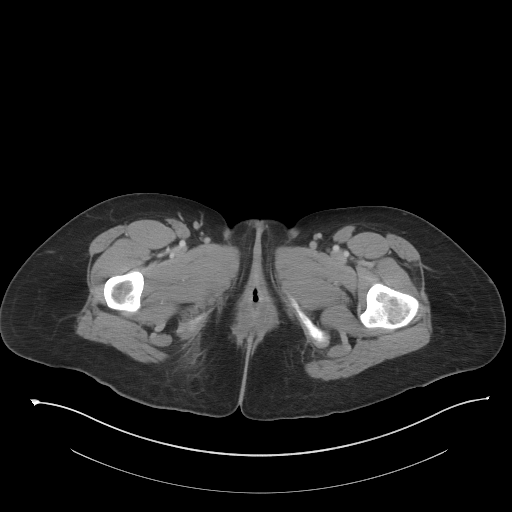
[im 4/93  bone]
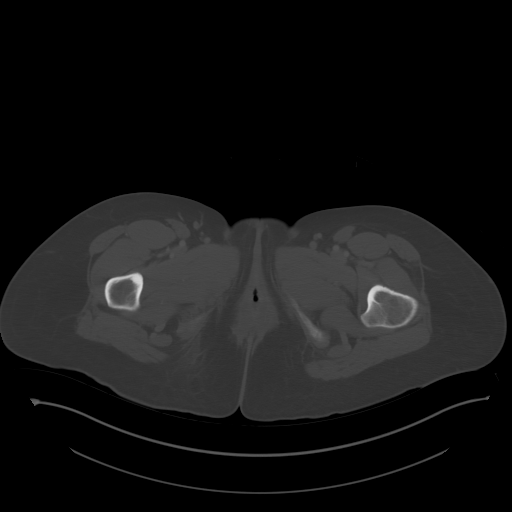
[im 12/93  soft-tissue]
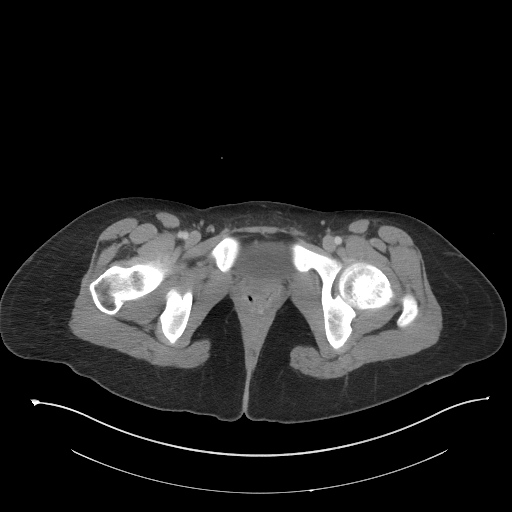
[im 20/93  soft-tissue]
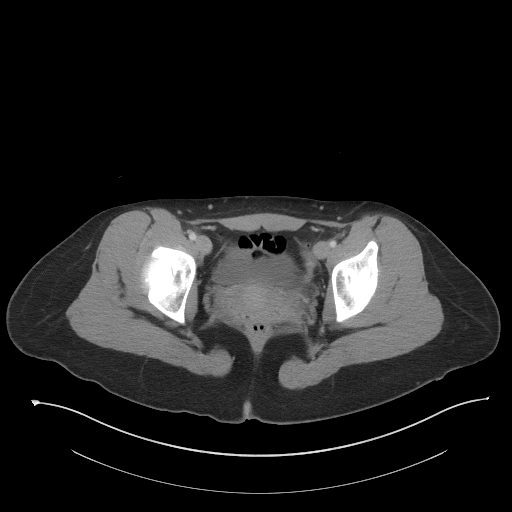
[im 27/93  soft-tissue]
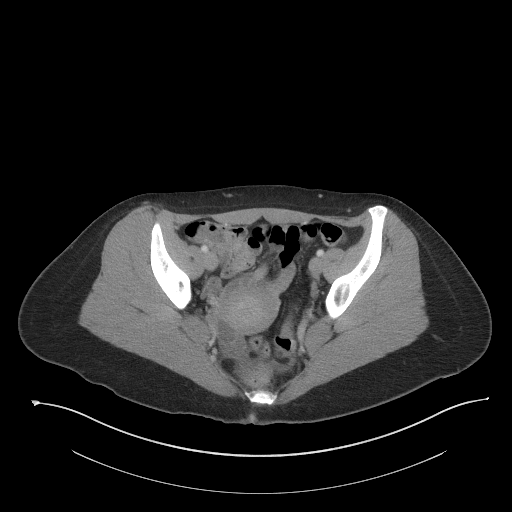
[im 31/93  soft-tissue]
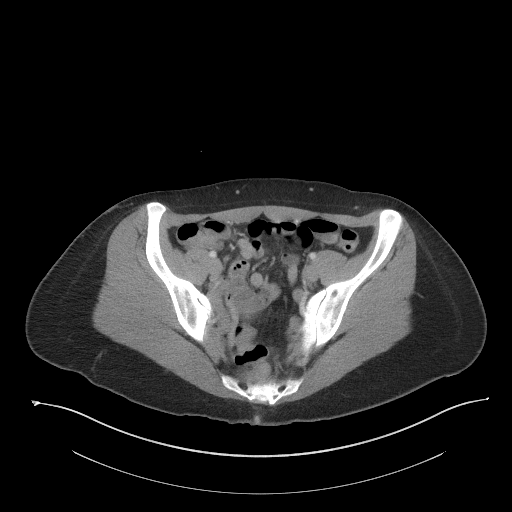
[im 39/93  soft-tissue]
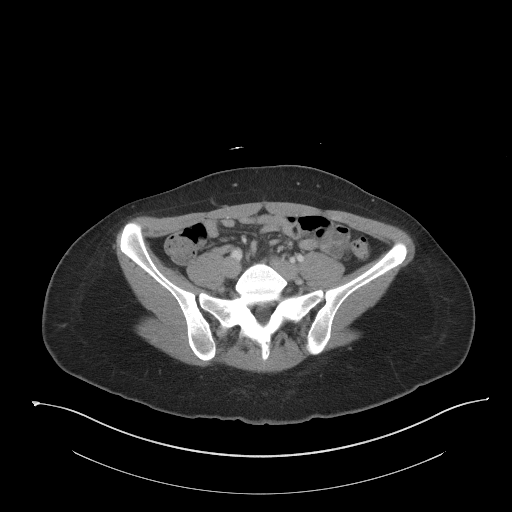
[im 47/93  soft-tissue]
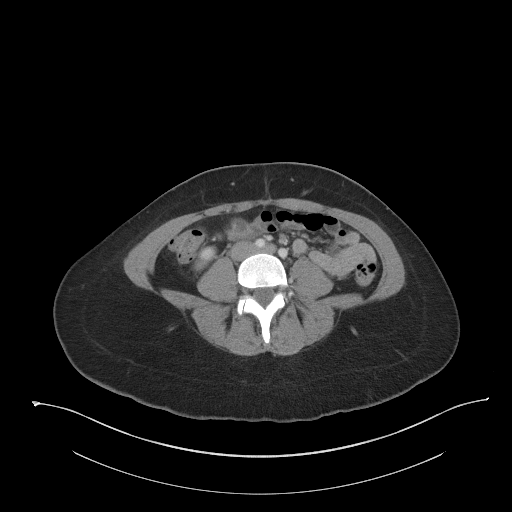
[im 54/93  soft-tissue]
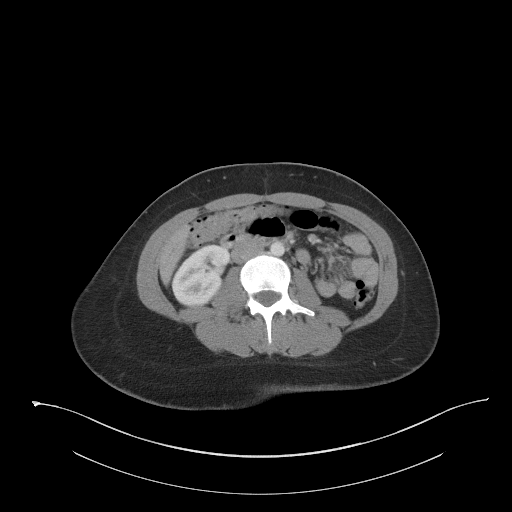
[im 62/93  soft-tissue]
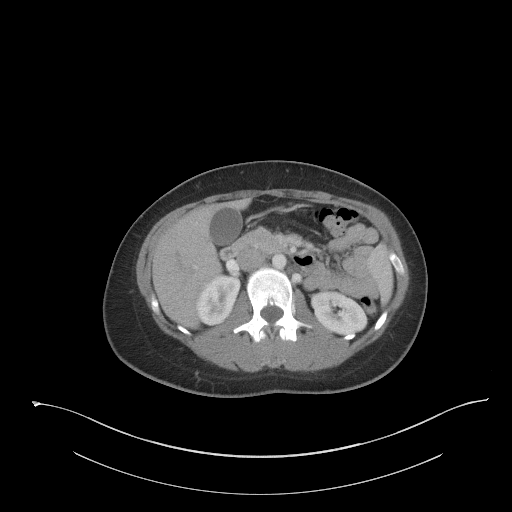
[im 62/93  bone]
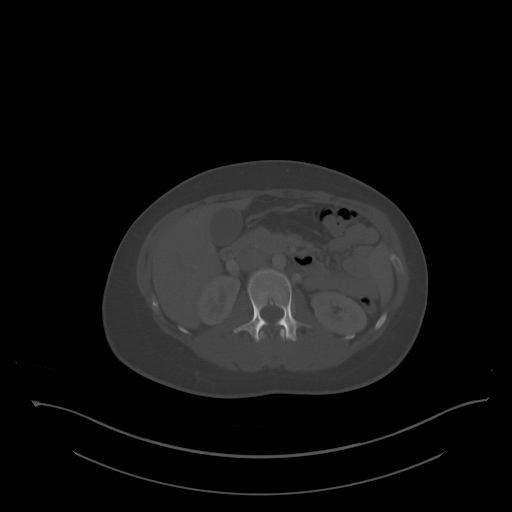
[im 66/93  soft-tissue]
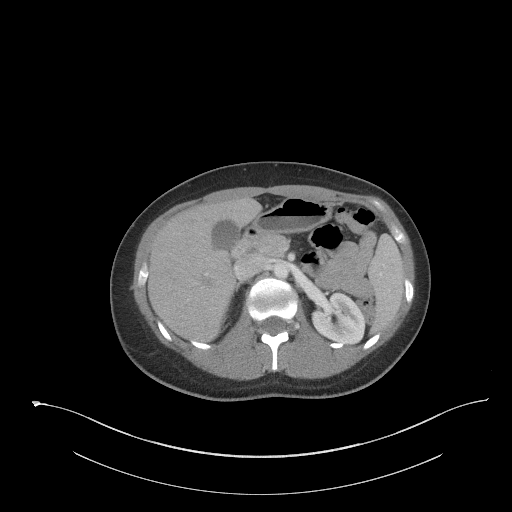
[im 73/93  soft-tissue]
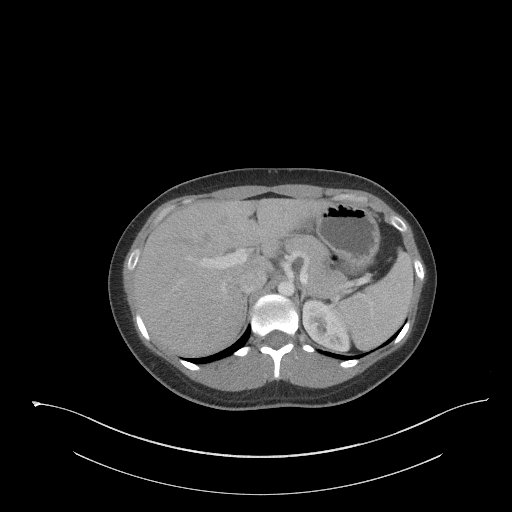
[im 81/93  soft-tissue]
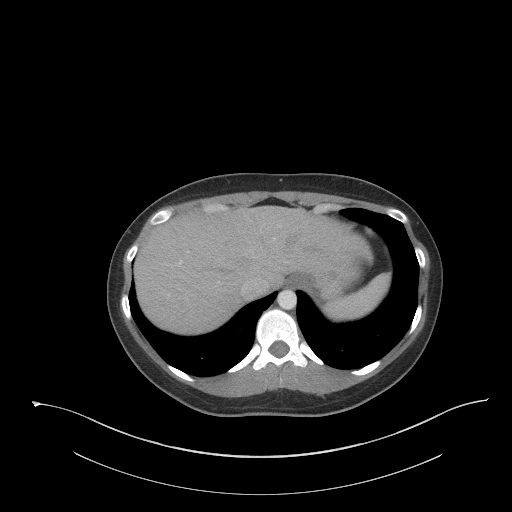
[im 89/93  soft-tissue]
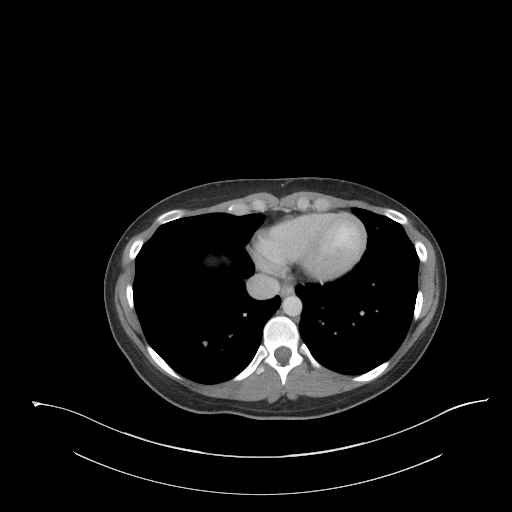

[Series 6: a/p w/ cor · coronal · 0.81mm/px · 3 of 158 slices shown]
[im 53/158  soft-tissue]
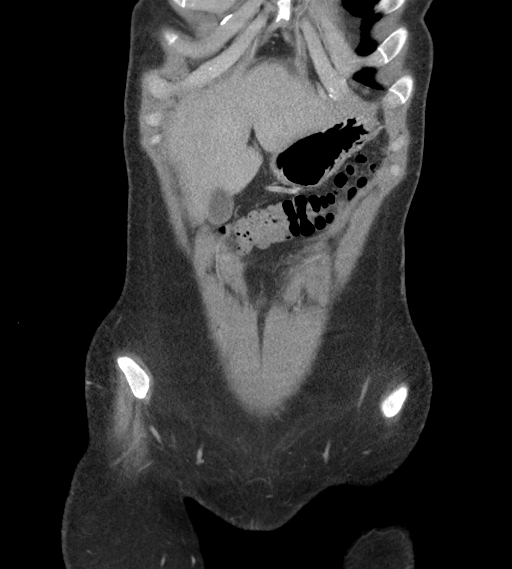
[im 70/158  soft-tissue]
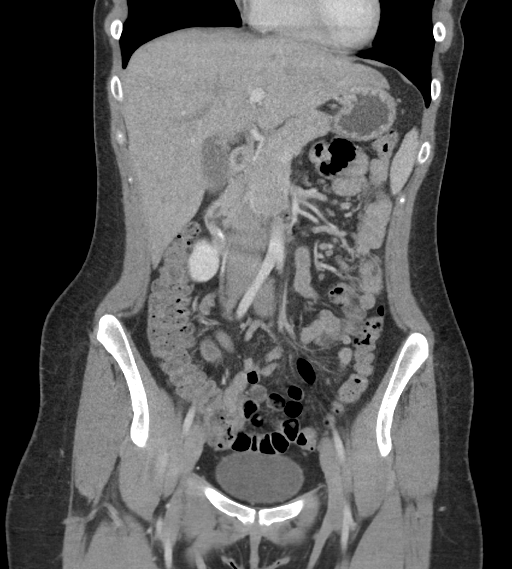
[im 88/158  soft-tissue]
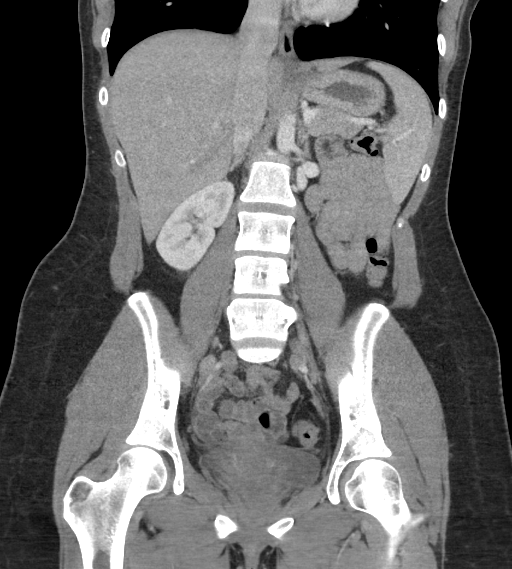

[16 of 46 positions shown; findings below may reference images not displayed]

FINDINGS: Lower chest: No acute abnormality.

Hepatobiliary: No focal liver abnormality is seen. No gallstones,
gallbladder wall thickening, or biliary dilatation.

Pancreas: Unremarkable. No pancreatic ductal dilatation or
surrounding inflammatory changes.

Spleen: Normal in size without focal abnormality.

Adrenals/Urinary Tract: Adrenal glands are within normal limits.
Kidneys are well visualized bilaterally. Normal enhancement pattern
is seen. No renal calculi or obstructive changes are noted. Bladder
is partially distended.

Stomach/Bowel: Scattered diverticular change of the colon is seen.
No findings to suggest diverticulitis are noted. The appendix is
well visualized and within normal limits. No inflammatory changes
are seen. No small bowel obstructive changes are seen. The stomach
is within normal limits.

Vascular/Lymphatic: No significant vascular findings are present. No
enlarged abdominal or pelvic lymph nodes. Circumaortic left renal
vein is noted.

Reproductive: 2.6 cm right ovarian cyst is noted. No uterine
abnormality is seen.

Other: Mild free fluid is noted which may be related to a ruptured
cyst.

Musculoskeletal: No acute or significant osseous findings.
IMPRESSION: Mild diverticulosis without diverticulitis.

Normal-appearing appendix.

2.6 cm right ovarian cyst with evidence of mild free fluid. A
recently ruptured cyst may account for these changes.

## 2020-04-19 DIAGNOSIS — F411 Generalized anxiety disorder: Secondary | ICD-10-CM | POA: Diagnosis not present

## 2020-04-28 DIAGNOSIS — R109 Unspecified abdominal pain: Secondary | ICD-10-CM | POA: Diagnosis not present

## 2020-04-28 DIAGNOSIS — R10817 Generalized abdominal tenderness: Secondary | ICD-10-CM | POA: Diagnosis not present

## 2020-04-28 DIAGNOSIS — R195 Other fecal abnormalities: Secondary | ICD-10-CM | POA: Diagnosis not present

## 2020-05-03 DIAGNOSIS — F411 Generalized anxiety disorder: Secondary | ICD-10-CM | POA: Diagnosis not present

## 2020-05-03 DIAGNOSIS — R195 Other fecal abnormalities: Secondary | ICD-10-CM | POA: Diagnosis not present

## 2020-05-10 DIAGNOSIS — K7689 Other specified diseases of liver: Secondary | ICD-10-CM | POA: Diagnosis not present

## 2020-05-10 DIAGNOSIS — R195 Other fecal abnormalities: Secondary | ICD-10-CM | POA: Diagnosis not present

## 2020-05-10 DIAGNOSIS — R109 Unspecified abdominal pain: Secondary | ICD-10-CM | POA: Diagnosis not present

## 2020-05-10 DIAGNOSIS — R110 Nausea: Secondary | ICD-10-CM | POA: Diagnosis not present

## 2020-05-10 DIAGNOSIS — Q600 Renal agenesis, unilateral: Secondary | ICD-10-CM | POA: Diagnosis not present

## 2020-05-17 DIAGNOSIS — F411 Generalized anxiety disorder: Secondary | ICD-10-CM | POA: Diagnosis not present

## 2020-05-25 ENCOUNTER — Emergency Department (HOSPITAL_BASED_OUTPATIENT_CLINIC_OR_DEPARTMENT_OTHER): Payer: Federal, State, Local not specified - PPO

## 2020-05-25 ENCOUNTER — Emergency Department (HOSPITAL_BASED_OUTPATIENT_CLINIC_OR_DEPARTMENT_OTHER)
Admission: EM | Admit: 2020-05-25 | Discharge: 2020-05-25 | Disposition: A | Discharge status: Home / Self Care | Payer: Federal, State, Local not specified - PPO | Attending: Emergency Medicine | Admitting: Emergency Medicine

## 2020-05-25 ENCOUNTER — Encounter (HOSPITAL_BASED_OUTPATIENT_CLINIC_OR_DEPARTMENT_OTHER): Payer: Self-pay | Admitting: Emergency Medicine

## 2020-05-25 ENCOUNTER — Other Ambulatory Visit: Payer: Self-pay

## 2020-05-25 DIAGNOSIS — K2900 Acute gastritis without bleeding: Secondary | ICD-10-CM

## 2020-05-25 DIAGNOSIS — R1011 Right upper quadrant pain: Secondary | ICD-10-CM | POA: Diagnosis not present

## 2020-05-25 LAB — CBC
HCT: 43.7 % (ref 36.0–46.0)
Hemoglobin: 14.9 g/dL (ref 12.0–15.0)
MCH: 31.4 pg (ref 26.0–34.0)
MCHC: 34.1 g/dL (ref 30.0–36.0)
MCV: 92.2 fL (ref 80.0–100.0)
Platelets: 248 10*3/uL (ref 150–400)
RBC: 4.74 MIL/uL (ref 3.87–5.11)
RDW: 12.6 % (ref 11.5–15.5)
WBC: 5.8 10*3/uL (ref 4.0–10.5)
nRBC: 0 % (ref 0.0–0.2)

## 2020-05-25 LAB — URINALYSIS, ROUTINE W REFLEX MICROSCOPIC
Bilirubin Urine: NEGATIVE
Glucose, UA: NEGATIVE mg/dL
Hgb urine dipstick: NEGATIVE
Ketones, ur: NEGATIVE mg/dL
Leukocytes,Ua: NEGATIVE
Nitrite: NEGATIVE
Protein, ur: NEGATIVE mg/dL
Specific Gravity, Urine: 1.025 (ref 1.005–1.030)
pH: 6.5 (ref 5.0–8.0)

## 2020-05-25 LAB — COMPREHENSIVE METABOLIC PANEL
ALT: 9 U/L (ref 0–44)
AST: 15 U/L (ref 15–41)
Albumin: 4.6 g/dL (ref 3.5–5.0)
Alkaline Phosphatase: 43 U/L (ref 38–126)
Anion gap: 10 (ref 5–15)
BUN: 12 mg/dL (ref 6–20)
CO2: 21 mmol/L — ABNORMAL LOW (ref 22–32)
Calcium: 9.1 mg/dL (ref 8.9–10.3)
Chloride: 106 mmol/L (ref 98–111)
Creatinine, Ser: 0.76 mg/dL (ref 0.44–1.00)
GFR calc Af Amer: >60 mL/min (ref >60)
GFR calc non Af Amer: >60 mL/min (ref >60)
Glucose, Bld: 94 mg/dL (ref 70–99)
Potassium: 3.8 mmol/L (ref 3.5–5.1)
Sodium: 137 mmol/L (ref 135–145)
Total Bilirubin: 0.8 mg/dL (ref 0.3–1.2)
Total Protein: 7.8 g/dL (ref 6.5–8.1)

## 2020-05-25 LAB — PREGNANCY, URINE: Preg Test, Ur: NEGATIVE

## 2020-05-25 LAB — LIPASE, BLOOD: Lipase: 24 U/L (ref 11–51)

## 2020-05-25 MED ORDER — PROMETHAZINE HCL 25 MG/ML IJ SOLN: 12.5000 mg | Freq: Once | INTRAMUSCULAR | Status: DC

## 2020-05-25 MED ORDER — HYDROMORPHONE HCL 1 MG/ML IJ SOLN
1.0000 mg | Freq: Once | INTRAMUSCULAR | Status: AC
  Administered 2020-05-25: 1 mg via INTRAVENOUS
  Filled 2020-05-25: qty 1

## 2020-05-25 MED ORDER — ONDANSETRON 4 MG PO TBDP
4.0000 mg | ORAL_TABLET | Freq: Three times a day (TID) | ORAL | 0 refills | Status: DC | PRN
Start: 2020-05-25 — End: 2024-04-02

## 2020-05-25 MED ORDER — SODIUM CHLORIDE 0.9 % IV BOLUS
1000.0000 mL | Freq: Once | INTRAVENOUS | Status: AC
  Administered 2020-05-25: 1000 mL via INTRAVENOUS

## 2020-05-25 MED ORDER — FAMOTIDINE 20 MG PO TABS
20.0000 mg | ORAL_TABLET | Freq: Two times a day (BID) | ORAL | 0 refills | Status: DC
Start: 2020-05-25 — End: 2024-04-02

## 2020-05-25 MED ORDER — IOHEXOL 300 MG/ML  SOLN
100.0000 mL | Freq: Once | INTRAMUSCULAR | Status: AC | PRN
  Administered 2020-05-25: 100 mL via INTRAVENOUS

## 2020-05-25 MED ORDER — ONDANSETRON HCL 4 MG/2ML IJ SOLN
4.0000 mg | Freq: Once | INTRAMUSCULAR | Status: AC
  Administered 2020-05-25: 4 mg via INTRAVENOUS
  Filled 2020-05-25: qty 2

## 2020-05-25 MED ORDER — METOCLOPRAMIDE HCL 5 MG/ML IJ SOLN
10.0000 mg | Freq: Once | INTRAMUSCULAR | Status: AC
  Administered 2020-05-25: 10 mg via INTRAVENOUS
  Filled 2020-05-25: qty 2

## 2020-05-25 MED FILL — ONDANSETRON ODT 4MG TBDP: 4 | 2 days supply | Qty: 5 | Fill #0

## 2020-05-25 NOTE — ED Provider Notes (Signed)
[facility] EMERGENCY DEPARTMENT Provider Note   CSN: [id number] Arrival date & time: 05/25/20  1033     History Chief Complaint  Patient presents with  . Abdominal Pain    Denise Howard is a 24 y.o. female with a past medical history of anxiety, fibromyalgia presenting to the ED with a chief complaint of right upper quadrant abdominal pain.  Beginning this morning around 8 AM after she woke up started experiencing sharp pain in her right upper quadrant that will intermittently radiate to the right mid back.  She has not tried any medications to help with her symptoms.  She was told several weeks ago by her gastroenterologist that her ultrasound showed a liver cyst.  This ultrasound was done as she has had "a lot of stomach issues like cramping and diarrhea so I went to see a specialist."  She was scheduled for an MRI next week but no further plan for this liver cyst has been made.  She denies any nausea, vomiting.  Denies any changes to bowel movements from baseline.  No suspicious food intake.  No pelvic pain or vaginal complaints.  No sick contacts with similar symptoms.  Denies any fevers, urinary symptoms, injuries or falls, chest pain, shortness of breath, cough.  HPI     Past Medical History:  Diagnosis Date  . Anxiety   . Fibromyalgia   . Hypermobile joints   . Migraines     There are no problems to display for this patient.   History reviewed. No pertinent surgical history.   OB History   No obstetric history on file.     No family history on file.  Social History   Tobacco Use  . Smoking status: Never Smoker  . Smokeless tobacco: Never Used  Substance Use Topics  . Alcohol use: Yes  . Drug use: No    Home Medications Prior to Admission medications   Medication Sig Start Date End Date Taking? Authorizing Provider  famotidine (PEPCID) 20 MG tablet Take 1 tablet (20 mg total) by mouth 2 (two) times daily. 05/25/20   Sharad Vaneaton, PA-C    ondansetron (ZOFRAN ODT) 4 MG disintegrating tablet Take 1 tablet (4 mg total) by mouth every 8 (eight) hours as needed for nausea or vomiting. 05/25/20   Dietrich Pates, PA-C    Allergies    Patient has no known allergies.  Review of Systems   Review of Systems  Constitutional: Negative for appetite change, chills and fever.  HENT: Negative for ear pain, rhinorrhea, sneezing and sore throat.   Eyes: Negative for photophobia and visual disturbance.  Respiratory: Negative for cough, chest tightness, shortness of breath and wheezing.   Cardiovascular: Negative for chest pain and palpitations.  Gastrointestinal: Positive for abdominal pain. Negative for blood in stool, constipation, diarrhea, nausea and vomiting.  Genitourinary: Negative for dysuria, hematuria and urgency.  Musculoskeletal: Negative for myalgias.  Skin: Negative for rash.  Neurological: Negative for dizziness, weakness and light-headedness.    Physical Exam Updated Vital Signs BP 125/77 (BP Location: Right Arm)   Pulse 62   Temp 98.9 F (37.2 C) (Oral)   Resp 16   Ht 5\' 7"  (1.702 m)   Wt 81.6 kg   LMP 04/26/2020   SpO2 100%   BMI 28.19 kg/m   Physical Exam Vitals and nursing note reviewed.  Constitutional:      General: She is not in acute distress.    Appearance: She is well-developed.  HENT:  Head: Normocephalic and atraumatic.     Nose: Nose normal.  Eyes:     General: No scleral icterus.       Left eye: No discharge.     Conjunctiva/sclera: Conjunctivae normal.  Cardiovascular:     Rate and Rhythm: Normal rate and regular rhythm.     Heart sounds: Normal heart sounds. No murmur heard.  No friction rub. No gallop.   Pulmonary:     Effort: Pulmonary effort is normal. No respiratory distress.     Breath sounds: Normal breath sounds.  Abdominal:     General: Bowel sounds are normal. There is no distension.     Palpations: Abdomen is soft.     Tenderness: There is abdominal tenderness in the  right upper quadrant. There is no guarding.  Musculoskeletal:        General: Normal range of motion.     Cervical back: Normal range of motion and neck supple.  Skin:    General: Skin is warm and dry.     Findings: No rash.  Neurological:     Mental Status: She is alert.     Motor: No abnormal muscle tone.     Coordination: Coordination normal.     ED Results / Procedures / Treatments   Labs (all labs ordered are listed, but only abnormal results are displayed) Labs Reviewed  COMPREHENSIVE METABOLIC PANEL - Abnormal; Notable for the following components:      Result Value   CO2 21 (*)    All other components within normal limits  LIPASE, BLOOD  CBC  URINALYSIS, ROUTINE W REFLEX MICROSCOPIC  PREGNANCY, URINE    EKG None  Radiology CT ABDOMEN PELVIS W CONTRAST  Result Date: 05/25/2020 CLINICAL DATA:  Right upper quadrant abdominal pain EXAM: CT ABDOMEN AND PELVIS WITH CONTRAST TECHNIQUE: Multidetector CT imaging of the abdomen and pelvis was performed using the standard protocol following bolus administration of intravenous contrast. CONTRAST:  OMNIPAQUE IOHEXOL 300 MG/ML  SOLN COMPARISON:  09/20/2019 FINDINGS: Lower chest: No acute abnormality. Hepatobiliary: No solid liver abnormality is seen. No gallstones, gallbladder wall thickening, or biliary dilatation. Pancreas: Unremarkable. No pancreatic ductal dilatation or surrounding inflammatory changes. Spleen: Normal in size without significant abnormality. Adrenals/Urinary Tract: Adrenal glands are unremarkable. Kidneys are normal, without renal calculi, solid lesion, or hydronephrosis. Bladder is unremarkable. Stomach/Bowel: Stomach is within normal limits. Appendix appears normal. No evidence of bowel wall thickening, distention, or inflammatory changes. Vascular/Lymphatic: No significant vascular findings are present. No enlarged abdominal or pelvic lymph nodes. Reproductive: No mass or other significant abnormality.  Bilateral ovarian cysts and follicles. Other: No abdominal wall hernia or abnormality. Trace free fluid in the low pelvis. Musculoskeletal: No acute or significant osseous findings. IMPRESSION: 1. No CT findings of the abdomen or pelvis to explain right upper quadrant pain. 2. Trace free fluid in the low pelvis, likely functional in the reproductive age setting. Bilateral ovarian cysts and follicles. Electronically Signed   By: Lauralyn Primes M.D.   On: 05/25/2020 16:37    Procedures Procedures (including critical care time)  Medications Ordered in ED Medications  HYDROmorphone (DILAUDID) injection 1 mg (1 mg Intravenous Given 05/25/20 1432)  sodium chloride 0.9 % bolus 1,000 mL (0 mLs Intravenous Stopped 05/25/20 1657)  ondansetron (ZOFRAN) injection 4 mg (4 mg Intravenous Given 05/25/20 1429)  iohexol (OMNIPAQUE) 300 MG/ML solution 100 mL (100 mLs Intravenous Contrast Given 05/25/20 1547)  metoCLOPramide (REGLAN) injection 10 mg (10 mg Intravenous Given 05/25/20 1655)  ED Course  I have reviewed the triage vital signs and the nursing notes.  Pertinent labs & imaging results that were available during my care of the patient were reviewed by me and considered in my medical decision making (see chart for details).    MDM Rules/Calculators/A&P                          24 year old female with a past medical history of anxiety, fibromyalgia presenting to the ED with a chief complaint of right upper quadrant abdominal pain.  Reports sharp right upper quadrant pain radiating to the since 8 AM this morning.  Reports associated nausea but denies any vomiting.  No changes to bowel movements from baseline.  She has had ongoing issues with bowel movements and has seen GI for this.  She had an abdominal ultrasound done about 2 weeks ago which showed cyst on her liver.  She is scheduled for an MRI next week for this.  No history of gallstones.  Denies urinary symptoms, fever, sick contacts with similar  symptoms or pelvic complaints.  On exam there is tenderness palpation of the right upper quadrant without rebound or guarding.  No CVA tenderness bilaterally.  No lower abdominal tenderness.  Work-up here significant for negative pregnancy test, unremarkable UA, unremarkable CBC, CMP and lipase.  CT scan shows no evidence of acute intra-abdominal cause of symptoms.  Patient symptoms controlled here attempted with Dilaudid and Zofran.  Unfortunately she continues to have heaving and nausea after Zofran.  Will attempt to control with Reglan suspect   On recheck, patient with significant improvement in her symptoms.  She is able to tolerate ginger ale without difficulty.  Suspect that her symptoms are viral in nature.  No evidence of acute intra-abdominal surgical or emergent cause on CT scan today.  She is comfortable with discharge home with GI follow-up.  We will have her return for worsening symptoms.    Patient is hemodynamically stable, in NAD, and able to ambulate in the ED. Evaluation does not show pathology that would require ongoing emergent intervention or inpatient treatment. I explained the diagnosis to the patient. Pain has been managed and has no complaints prior to discharge. Patient is comfortable with above plan and is stable for discharge at this time. All questions were answered prior to disposition. Strict return precautions for returning to the ED were discussed. Encouraged follow up with PCP.   An After Visit Summary was printed and given to the patient.   Portions of this note were generated with Scientist, clinical (histocompatibility and immunogenetics). Dictation errors may occur despite best attempts at proofreading.  Final Clinical Impression(s) / ED Diagnoses Final diagnoses:  Acute gastritis without hemorrhage, unspecified gastritis type    Rx / DC Orders ED Discharge Orders         Ordered    famotidine (PEPCID) 20 MG tablet  2 times daily     Discontinue  Reprint     05/25/20 1755    ondansetron  (ZOFRAN ODT) 4 MG disintegrating tablet  Every 8 hours PRN     Discontinue  Reprint     05/25/20 1755           Dietrich Pates, PA-C 05/25/20 1756    Terald Sleeper, MD 05/25/20 1821

## 2020-05-25 NOTE — Discharge Instructions (Signed)
Take medications as needed to help with your discomfort. You can continue Tylenol as needed to help with your pain. Follow-up with a GI specialist. Slowly advance her diet and make sure you are drinking plenty of fluids. Return to the ER for worsening pain, inability to tolerate anything by mouth, vomiting or coughing up blood, fever.

## 2020-05-25 NOTE — ED Triage Notes (Signed)
RUQ pain since this morning. Recently dx with a cyst on her liver and is scheduled for an MRI next week. Her GI dr told her to come in for eval.

## 2020-05-30 DIAGNOSIS — K7689 Other specified diseases of liver: Secondary | ICD-10-CM | POA: Diagnosis not present

## 2020-06-08 DIAGNOSIS — R109 Unspecified abdominal pain: Secondary | ICD-10-CM | POA: Diagnosis not present

## 2020-06-08 DIAGNOSIS — R195 Other fecal abnormalities: Secondary | ICD-10-CM | POA: Diagnosis not present

## 2020-06-20 DIAGNOSIS — R109 Unspecified abdominal pain: Secondary | ICD-10-CM | POA: Diagnosis not present

## 2020-06-20 DIAGNOSIS — R197 Diarrhea, unspecified: Secondary | ICD-10-CM | POA: Diagnosis not present

## 2020-06-20 DIAGNOSIS — K641 Second degree hemorrhoids: Secondary | ICD-10-CM | POA: Diagnosis not present

## 2020-06-20 DIAGNOSIS — R195 Other fecal abnormalities: Secondary | ICD-10-CM | POA: Diagnosis not present

## 2020-06-20 DIAGNOSIS — Z1211 Encounter for screening for malignant neoplasm of colon: Secondary | ICD-10-CM | POA: Diagnosis not present

## 2020-07-12 DIAGNOSIS — F411 Generalized anxiety disorder: Secondary | ICD-10-CM | POA: Diagnosis not present

## 2020-07-19 DIAGNOSIS — F411 Generalized anxiety disorder: Secondary | ICD-10-CM | POA: Diagnosis not present

## 2020-07-25 DIAGNOSIS — N760 Acute vaginitis: Secondary | ICD-10-CM | POA: Diagnosis not present

## 2020-07-25 DIAGNOSIS — Z3040 Encounter for surveillance of contraceptives, unspecified: Secondary | ICD-10-CM | POA: Diagnosis not present

## 2020-07-25 DIAGNOSIS — Z113 Encounter for screening for infections with a predominantly sexual mode of transmission: Secondary | ICD-10-CM | POA: Diagnosis not present

## 2020-08-02 DIAGNOSIS — F411 Generalized anxiety disorder: Secondary | ICD-10-CM | POA: Diagnosis not present

## 2020-08-16 DIAGNOSIS — F411 Generalized anxiety disorder: Secondary | ICD-10-CM | POA: Diagnosis not present

## 2020-08-21 DIAGNOSIS — F411 Generalized anxiety disorder: Secondary | ICD-10-CM | POA: Diagnosis not present

## 2020-09-06 DIAGNOSIS — F411 Generalized anxiety disorder: Secondary | ICD-10-CM | POA: Diagnosis not present

## 2020-09-27 DIAGNOSIS — F411 Generalized anxiety disorder: Secondary | ICD-10-CM | POA: Diagnosis not present

## 2020-10-04 DIAGNOSIS — F411 Generalized anxiety disorder: Secondary | ICD-10-CM | POA: Diagnosis not present

## 2020-10-04 DIAGNOSIS — F331 Major depressive disorder, recurrent, moderate: Secondary | ICD-10-CM | POA: Diagnosis not present

## 2020-10-17 DIAGNOSIS — F320 Major depressive disorder, single episode, mild: Secondary | ICD-10-CM | POA: Diagnosis not present

## 2020-10-17 DIAGNOSIS — F411 Generalized anxiety disorder: Secondary | ICD-10-CM | POA: Diagnosis not present

## 2020-10-17 DIAGNOSIS — F902 Attention-deficit hyperactivity disorder, combined type: Secondary | ICD-10-CM | POA: Diagnosis not present

## 2020-10-24 DIAGNOSIS — F411 Generalized anxiety disorder: Secondary | ICD-10-CM | POA: Diagnosis not present

## 2020-10-24 DIAGNOSIS — F331 Major depressive disorder, recurrent, moderate: Secondary | ICD-10-CM | POA: Diagnosis not present

## 2020-12-20 IMAGING — CT CT ABD-PELV W/ CM
2 of 4 series · 16 of 46 positions shown, 18 images · IV contrast (omnipaque)
Comparison: 09/20/2019

CLINICAL DATA: Right upper quadrant abdominal pain

EXAM:
CT ABDOMEN AND PELVIS WITH CONTRAST
TECHNIQUE: Multidetector CT imaging of the abdomen and pelvis was performed
using the standard protocol following bolus administration of
intravenous contrast.
CONTRAST:  100mL OMNIPAQUE IOHEXOL 300 MG/ML  SOLN

[Series 2: axial st · axial · 0.69mm/px · z∈[+56,+451]mm · 13 of 87 slices shown, 15 images]
[im 4/87  soft-tissue]
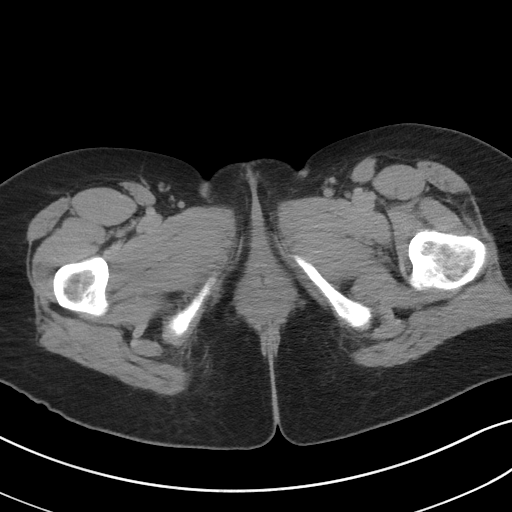
[im 4/87  bone]
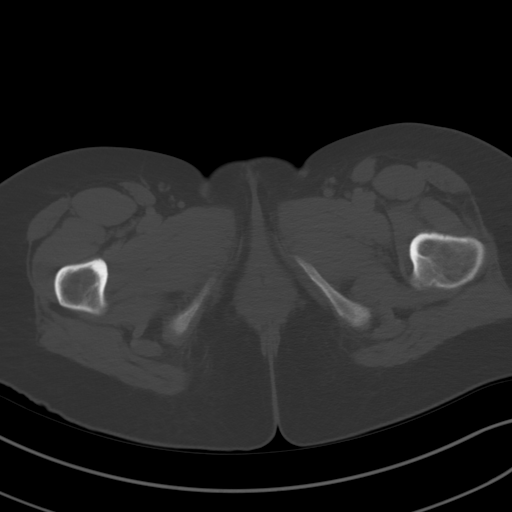
[im 11/87  soft-tissue]
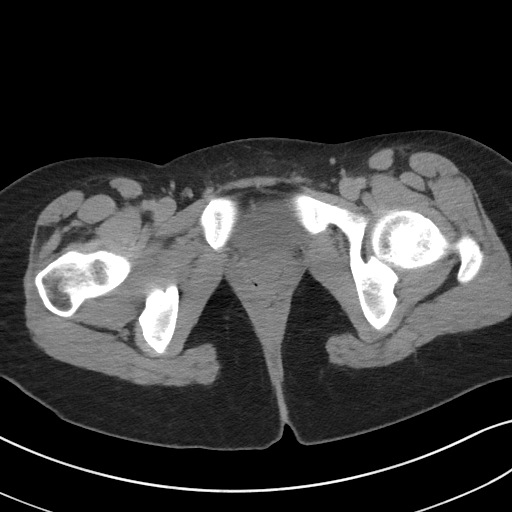
[im 18/87  soft-tissue]
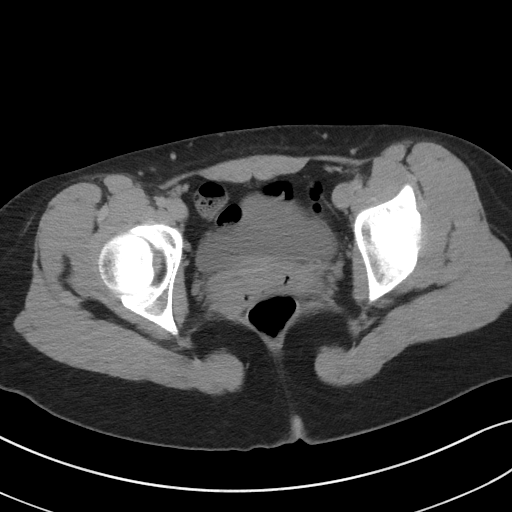
[im 25/87  soft-tissue]
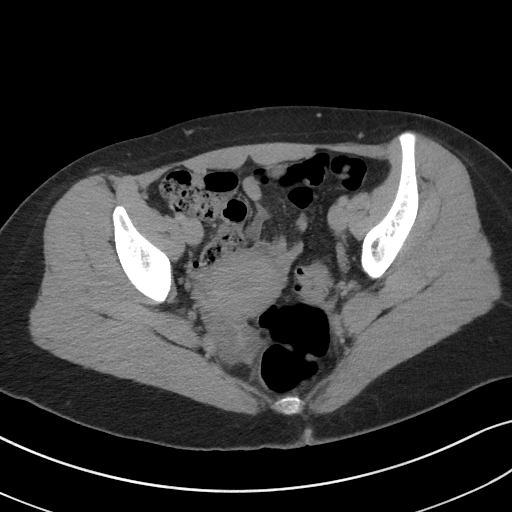
[im 31/87  soft-tissue]
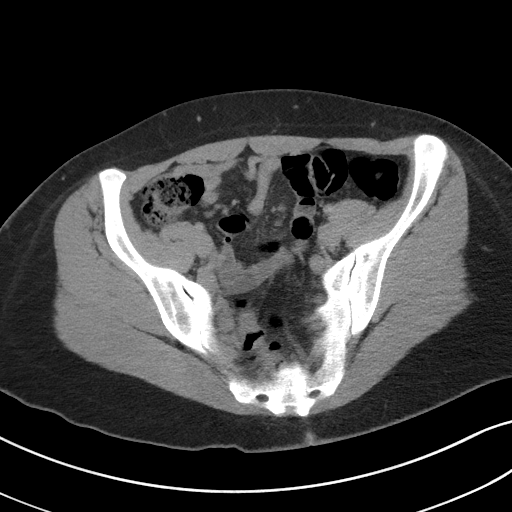
[im 38/87  soft-tissue]
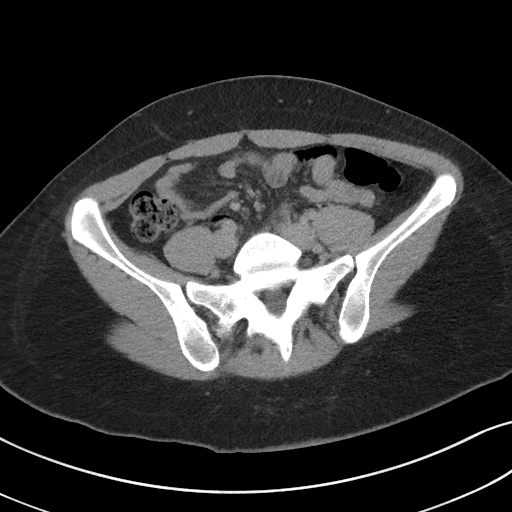
[im 45/87  soft-tissue]
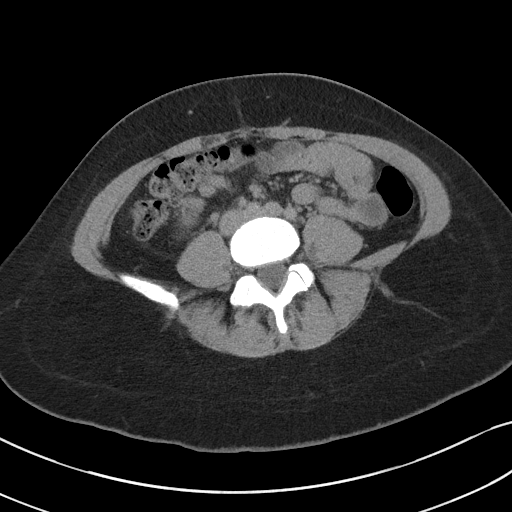
[im 49/87  soft-tissue]
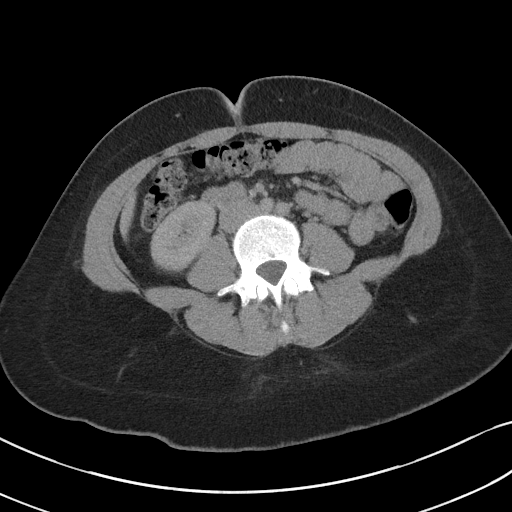
[im 56/87  soft-tissue]
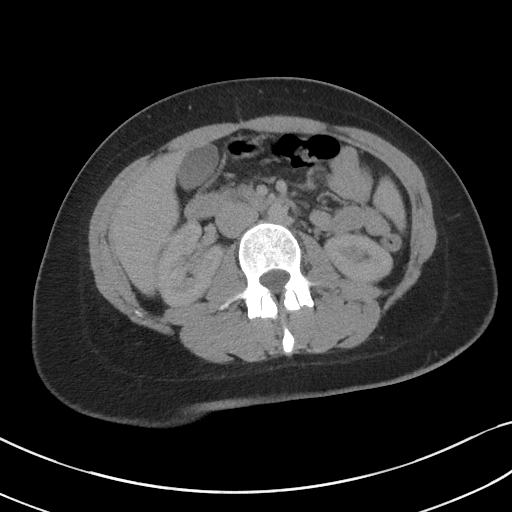
[im 56/87  bone]
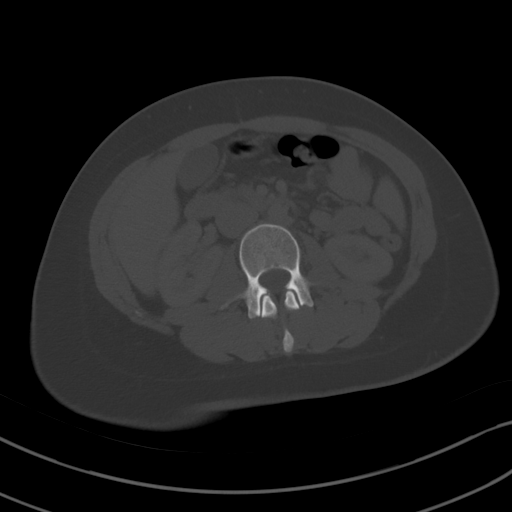
[im 62/87  soft-tissue]
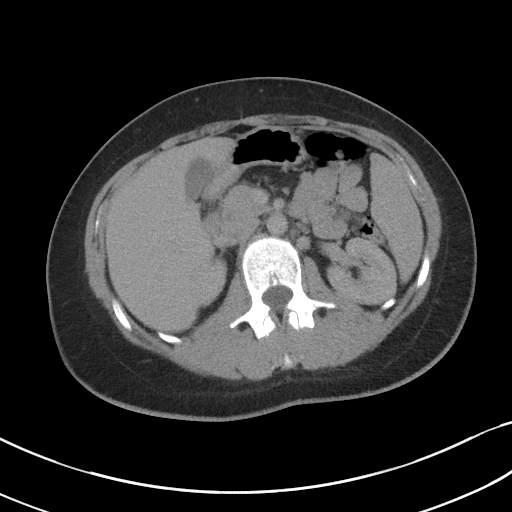
[im 69/87  soft-tissue]
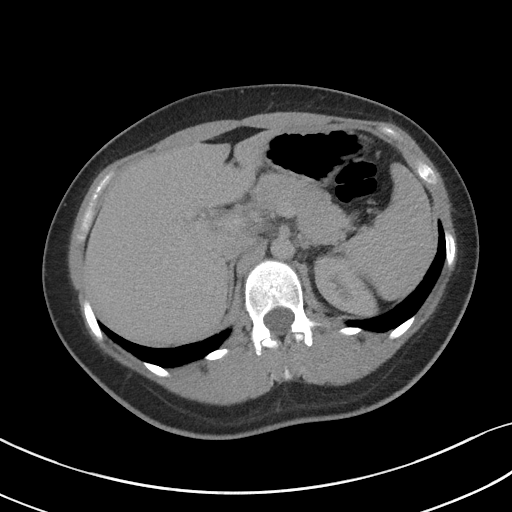
[im 76/87  soft-tissue]
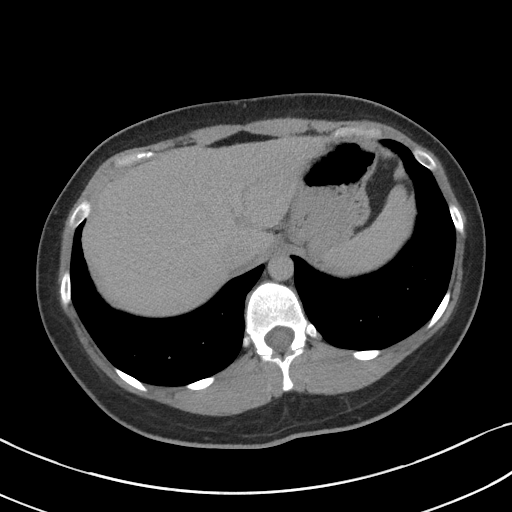
[im 83/87  soft-tissue]
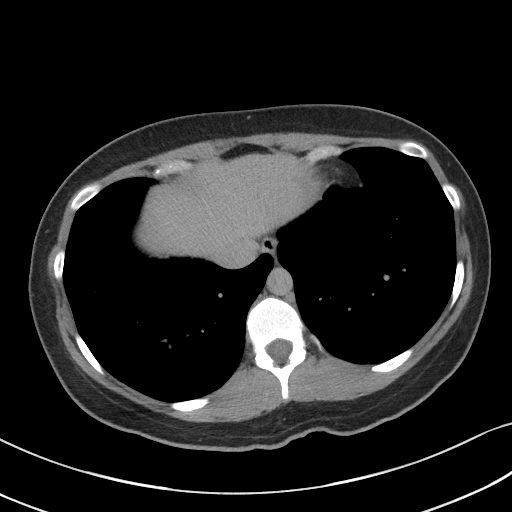

[Series 5: coronal st · coronal · 0.66mm/px · 3 of 92 slices shown]
[im 31/92  soft-tissue]
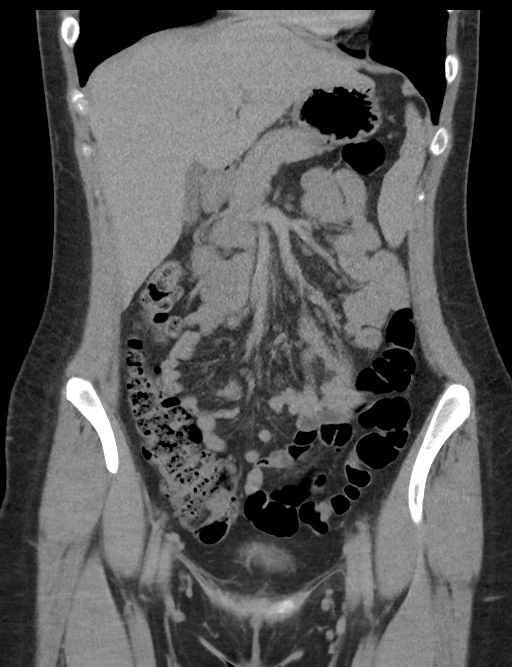
[im 41/92  soft-tissue]
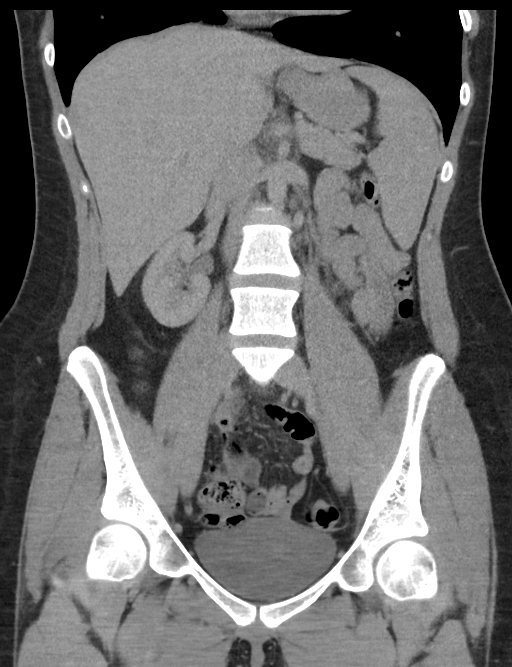
[im 51/92  soft-tissue]
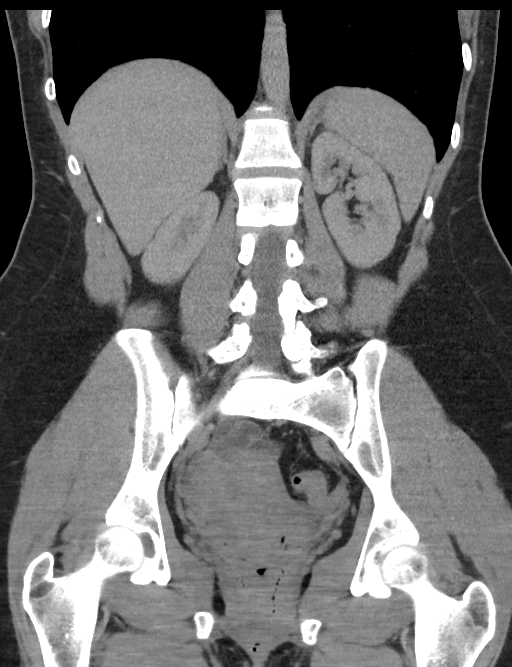

[16 of 46 positions shown; findings below may reference images not displayed]

FINDINGS: Lower chest: No acute abnormality.

Hepatobiliary: No solid liver abnormality is seen. No gallstones,
gallbladder wall thickening, or biliary dilatation.

Pancreas: Unremarkable. No pancreatic ductal dilatation or
surrounding inflammatory changes.

Spleen: Normal in size without significant abnormality.

Adrenals/Urinary Tract: Adrenal glands are unremarkable. Kidneys are
normal, without renal calculi, solid lesion, or hydronephrosis.
Bladder is unremarkable.

Stomach/Bowel: Stomach is within normal limits. Appendix appears
normal. No evidence of bowel wall thickening, distention, or
inflammatory changes.

Vascular/Lymphatic: No significant vascular findings are present. No
enlarged abdominal or pelvic lymph nodes.

Reproductive: No mass or other significant abnormality. Bilateral
ovarian cysts and follicles.

Other: No abdominal wall hernia or abnormality. Trace free fluid in
the low pelvis.

Musculoskeletal: No acute or significant osseous findings.
IMPRESSION: 1. No CT findings of the abdomen or pelvis to explain right upper
quadrant pain.
2. Trace free fluid in the low pelvis, likely functional in the
reproductive age setting. Bilateral ovarian cysts and follicles.

## 2023-08-18 LAB — OB RESULTS CONSOLE HIV ANTIBODY (ROUTINE TESTING): HIV: NONREACTIVE

## 2023-08-18 LAB — OB RESULTS CONSOLE RUBELLA ANTIBODY, IGM: Rubella: IMMUNE

## 2023-08-18 LAB — OB RESULTS CONSOLE RPR: RPR: NONREACTIVE

## 2023-08-18 LAB — HEPATITIS C ANTIBODY: HCV Ab: NEGATIVE

## 2023-08-18 LAB — OB RESULTS CONSOLE HEPATITIS B SURFACE ANTIGEN: Hepatitis B Surface Ag: NEGATIVE

## 2023-09-01 LAB — OB RESULTS CONSOLE GC/CHLAMYDIA
Chlamydia: NEGATIVE
Neisseria Gonorrhea: NEGATIVE

## 2023-10-29 NOTE — L&D Delivery Note (Signed)
 Operative Delivery Note At 9:00 AM a viable female was delivered via Vaginal, Vacuum Investment banker, operational).  Presentation: vertex; Position: Right,, Occiput,, Anterior; Station: +3.  Vacuum-assisted delivery was recommended for fetal bradycardia.  Verbal consent: obtained from patient.  Risks and benefits discussed in detail.  Risks include, but are not limited to the risks of anesthesia, bleeding, infection, damage to maternal tissues, fetal cephalhematoma.  There is also the risk of inability to effect vaginal delivery of the head, or shoulder dystocia that cannot be resolved by established maneuvers, leading to the need for emergency cesarean section.  APGAR: 6, 9; weight 10 lb 2.3 oz (4600 g).   Placenta status: intact, labor and delivery.   Cord:  with the following complications:  Anesthesia:  Epidural Instruments: Kiwi Episiotomy: None Lacerations: 2nd degree Suture Repair: 2.0 vicryl rapide Est. Blood Loss (mL):  200 cc EBL, QBL pend  Mom to postpartum.  Baby to Couplet care / Skin to Skin.  Gretchen Leavell 04/01/2024, 9:29 AM

## 2024-03-04 LAB — OB RESULTS CONSOLE GBS: GBS: NEGATIVE

## 2024-03-30 ENCOUNTER — Encounter (HOSPITAL_COMMUNITY): Payer: Self-pay | Admitting: *Deleted

## 2024-03-30 ENCOUNTER — Inpatient Hospital Stay (HOSPITAL_COMMUNITY)
Admission: AD | Admit: 2024-03-30 | Discharge: 2024-04-02 | DRG: 807 | Disposition: A | Discharge status: Home / Self Care | Attending: Obstetrics and Gynecology | Admitting: Obstetrics and Gynecology

## 2024-03-30 ENCOUNTER — Other Ambulatory Visit: Payer: Self-pay

## 2024-03-30 DIAGNOSIS — Z3A40 40 weeks gestation of pregnancy: Secondary | ICD-10-CM | POA: Diagnosis not present

## 2024-03-30 DIAGNOSIS — O480 Post-term pregnancy: Principal | ICD-10-CM | POA: Diagnosis present

## 2024-03-30 LAB — CBC
HCT: 42.6 % (ref 36.0–46.0)
Hemoglobin: 14.8 g/dL (ref 12.0–15.0)
MCH: 32.8 pg (ref 26.0–34.0)
MCHC: 34.7 g/dL (ref 30.0–36.0)
MCV: 94.5 fL (ref 80.0–100.0)
Platelets: 227 10*3/uL (ref 150–400)
RBC: 4.51 MIL/uL (ref 3.87–5.11)
RDW: 13.7 % (ref 11.5–15.5)
WBC: 14.5 10*3/uL — ABNORMAL HIGH (ref 4.0–10.5)
nRBC: 0 % (ref 0.0–0.2)

## 2024-03-30 LAB — TYPE AND SCREEN
ABO/RH(D): A POS
Antibody Screen: NEGATIVE

## 2024-03-30 MED ORDER — TERBUTALINE SULFATE 1 MG/ML IJ SOLN: 0.2500 mg | Freq: Once | INTRAMUSCULAR | Status: DC | PRN

## 2024-03-30 MED ORDER — OXYTOCIN BOLUS FROM INFUSION
333.0000 mL | Freq: Once | INTRAVENOUS | Status: AC
  Administered 2024-04-01: 333 mL via INTRAVENOUS

## 2024-03-30 MED ORDER — ONDANSETRON HCL 4 MG/2ML IJ SOLN: 4.0000 mg | Freq: Four times a day (QID) | INTRAMUSCULAR | Status: DC | PRN

## 2024-03-30 MED ORDER — OXYCODONE-ACETAMINOPHEN 5-325 MG PO TABS: 1.0000 | ORAL_TABLET | ORAL | Status: DC | PRN

## 2024-03-30 MED ORDER — MISOPROSTOL 25 MCG QUARTER TABLET
25.0000 ug | ORAL_TABLET | ORAL | Status: DC
  Administered 2024-03-30 – 2024-03-31 (×2): 25 ug via VAGINAL
  Filled 2024-03-30 (×2): qty 1

## 2024-03-30 MED ORDER — FENTANYL-BUPIVACAINE-NACL 0.5-0.125-0.9 MG/250ML-% EP SOLN
12.0000 mL/h | EPIDURAL | Status: DC | PRN
  Administered 2024-03-31: 12 mL/h via EPIDURAL
  Filled 2024-03-30: qty 250

## 2024-03-30 MED ORDER — LIDOCAINE HCL (PF) 1 % IJ SOLN: 30.0000 mL | INTRAMUSCULAR | Status: DC | PRN

## 2024-03-30 MED ORDER — OXYTOCIN-SODIUM CHLORIDE 30-0.9 UT/500ML-% IV SOLN
2.5000 [IU]/h | INTRAVENOUS | Status: DC
  Administered 2024-04-01: 2.5 [IU]/h via INTRAVENOUS
  Filled 2024-03-30: qty 500

## 2024-03-30 MED ORDER — OXYCODONE-ACETAMINOPHEN 5-325 MG PO TABS: 2.0000 | ORAL_TABLET | ORAL | Status: DC | PRN

## 2024-03-30 MED ORDER — PHENYLEPHRINE 80 MCG/ML (10ML) SYRINGE FOR IV PUSH (FOR BLOOD PRESSURE SUPPORT): 80.0000 ug | PREFILLED_SYRINGE | INTRAVENOUS | Status: DC | PRN

## 2024-03-30 MED ORDER — EPHEDRINE 5 MG/ML INJ: 10.0000 mg | INTRAVENOUS | Status: DC | PRN

## 2024-03-30 MED ORDER — LACTATED RINGERS IV SOLN: INTRAVENOUS | Status: AC

## 2024-03-30 MED ORDER — LACTATED RINGERS IV SOLN
500.0000 mL | Freq: Once | INTRAVENOUS | Status: AC
  Administered 2024-03-31: 500 mL via INTRAVENOUS

## 2024-03-30 MED ORDER — SOD CITRATE-CITRIC ACID 500-334 MG/5ML PO SOLN: 30.0000 mL | ORAL | Status: DC | PRN

## 2024-03-30 MED ORDER — MISOPROSTOL 25 MCG QUARTER TABLET
25.0000 ug | ORAL_TABLET | Freq: Once | ORAL | Status: AC
  Administered 2024-03-30: 25 ug via ORAL
  Filled 2024-03-30: qty 1

## 2024-03-30 MED ORDER — ACETAMINOPHEN 325 MG PO TABS: 650.0000 mg | ORAL_TABLET | ORAL | Status: DC | PRN

## 2024-03-30 MED ORDER — LACTATED RINGERS IV SOLN: 500.0000 mL | INTRAVENOUS | Status: AC | PRN

## 2024-03-30 MED ORDER — MISOPROSTOL 25 MCG QUARTER TABLET
25.0000 ug | ORAL_TABLET | Freq: Once | ORAL | Status: AC
  Administered 2024-03-30: 25 ug via VAGINAL
  Filled 2024-03-30: qty 1

## 2024-03-30 MED ORDER — MISOPROSTOL 25 MCG QUARTER TABLET
25.0000 ug | ORAL_TABLET | ORAL | Status: DC
  Administered 2024-03-30 – 2024-03-31 (×2): 25 ug via ORAL
  Filled 2024-03-30 (×2): qty 1

## 2024-03-30 MED ORDER — DIPHENHYDRAMINE HCL 50 MG/ML IJ SOLN: 12.5000 mg | INTRAMUSCULAR | Status: DC | PRN

## 2024-03-30 NOTE — H&P (Signed)
 Denise Howard is a 28 y.o. G 1 P0 at 39 w 2 days presents for IOL secondary to BPP 6/10 today after non reactive NST. No decelerations noted on office NST. GBBS is negative.  OB History     Gravida  1   Para      Term      Preterm      AB      Living         SAB      IAB      Ectopic      Multiple      Live Births             Past Medical History:  Diagnosis Date   Anxiety    Fibromyalgia    Hypermobile joints    Migraines    History reviewed. No pertinent surgical history. Family History: family history is not on file. Social History:  reports that she has never smoked. She has never used smokeless tobacco. She reports that she does not currently use alcohol. She reports that she does not use drugs.     Maternal Diabetes: No Genetic Screening: Normal Maternal Ultrasounds/Referrals: Normal Fetal Ultrasounds or other Referrals:  None Maternal Substance Abuse:  No Significant Maternal Medications:  None Significant Maternal Lab Results:  Group B Strep negative Number of Prenatal Visits:greater than 3 verified prenatal visits Maternal Vaccinations:Flu Other Comments:  None  Review of Systems History   There were no vitals taken for this visit. Maternal Exam:  Abdomen: Fetal presentation: vertex   Fetal Exam Fetal State Assessment: Category I - tracings are normal.   Physical Exam Vitals and nursing note reviewed. Exam conducted with a chaperone present.  HENT:     Head: Normocephalic.  Cardiovascular:     Rate and Rhythm: Normal rate and regular rhythm.     Pulses: Normal pulses.  Musculoskeletal:     Cervical back: Normal range of motion.  Neurological:     Mental Status: She is alert.     Prenatal labs: ABO, Rh:   Antibody:   Rubella:   RPR:    HBsAg:    HIV:    GBS:     Assessment/Plan: IUP  Post dates BPP 6/10 Admit  Cytotec IOL   Denise Howard Denise Howard 03/30/2024, 4:12 PM

## 2024-03-30 NOTE — Plan of Care (Signed)

## 2024-03-31 ENCOUNTER — Inpatient Hospital Stay (HOSPITAL_COMMUNITY): Admitting: Anesthesiology

## 2024-03-31 LAB — RPR: RPR Ser Ql: NONREACTIVE

## 2024-03-31 MED ORDER — TERBUTALINE SULFATE 1 MG/ML IJ SOLN
0.2500 mg | Freq: Once | INTRAMUSCULAR | Status: DC | PRN
Start: 2024-03-31 — End: 2024-04-01

## 2024-03-31 MED ORDER — LIDOCAINE HCL (PF) 1 % IJ SOLN
INTRAMUSCULAR | Status: DC | PRN
  Administered 2024-03-31: 11 mL via EPIDURAL

## 2024-03-31 MED ORDER — LACTATED RINGERS IV SOLN: INTRAVENOUS | Status: DC

## 2024-03-31 MED ORDER — MISOPROSTOL 50MCG HALF TABLET
50.0000 ug | ORAL_TABLET | ORAL | Status: DC
  Administered 2024-03-31: 50 ug via BUCCAL
  Filled 2024-03-31: qty 1

## 2024-03-31 MED ORDER — OXYTOCIN-SODIUM CHLORIDE 30-0.9 UT/500ML-% IV SOLN
1.0000 m[IU]/min | INTRAVENOUS | Status: DC
  Administered 2024-03-31: 2 m[IU]/min via INTRAVENOUS
  Filled 2024-03-31: qty 500

## 2024-03-31 NOTE — Anesthesia Procedure Notes (Signed)
 Epidural Patient location during procedure: OB Start time: 03/31/2024 7:17 PM End time: 03/31/2024 9:33 PM  Staffing Anesthesiologist: Earvin Goldberg, MD Performed: anesthesiologist   Preanesthetic Checklist Completed: patient identified, IV checked, site marked, risks and benefits discussed, surgical consent, monitors and equipment checked, pre-op evaluation and timeout performed  Epidural Patient position: sitting Prep: ChloraPrep Patient monitoring: heart rate, cardiac monitor, continuous pulse ox and blood pressure Approach: midline Location: L2-L3 Injection technique: LOR saline  Needle:  Needle type: Tuohy  Needle gauge: 17 G Needle length: 9 cm Needle insertion depth: 8 cm Catheter type: closed end flexible Catheter size: 20 Guage Catheter at skin depth: 13 cm Test dose: negative  Assessment Events: blood not aspirated, injection not painful, no injection resistance, no paresthesia and negative IV test  Additional Notes Reason for block:procedure for pain

## 2024-03-31 NOTE — Progress Notes (Signed)
 Denise Howard is a 28 y.o. G1P0 at [redacted]w[redacted]d by LMP admitted for induction of labor due to Non-reactive NST.  Subjective:   Objective: BP (!) 157/93   Pulse (!) 103   Temp 98.3 F (36.8 C) (Oral)   Resp 20   Ht 5\' 7"  (1.702 m)   Wt 117 kg   SpO2 100%   BMI 40.41 kg/m  No intake/output data recorded. No intake/output data recorded.  FHT:  FHR: 145 bpm, variability: moderate,  accelerations:  Present,  decelerations:  Absent UC:   none SVE:   Dilation: 1 Effacement (%): 50 Station: -3, Ballotable Exam by:: rebecca zhang,rnc-ob  Labs: Lab Results  Component Value Date   WBC 14.5 (H) 03/30/2024   HGB 14.8 03/30/2024   HCT 42.6 03/30/2024   MCV 94.5 03/30/2024   PLT 227 03/30/2024    Assessment / Plan: Induction of labor due to non-reassuring fetal testing,  progressing well on pitocin  Labor: Progressing normally Preeclampsia:  no signs or symptoms of toxicity, intake and ouput balanced, and labs stable Fetal Wellbeing:  Category I Pain Control:  Labor support without medications I/D:  n/a Anticipated MOD:  NSVD  [name], MD 03/31/2024, 8:17 AM

## 2024-03-31 NOTE — Progress Notes (Signed)
 Denise Howard is a 28 y.o. G1P0 at [redacted]w[redacted]d by LMP admitted for induction of labor due to Non-reactive NST.  Subjective:   Objective: BP (!) 150/98   Pulse 99   Temp 98.4 F (36.9 C)   Resp 20   Ht 5\' 7"  (1.702 m)   Wt 117 kg   SpO2 100%   BMI 40.41 kg/m  No intake/output data recorded. No intake/output data recorded.  FHT:  FHR: 145 bpm, variability: moderate,  accelerations:  Present,  decelerations:  Absent UC:   regular, every 3-5 minutes SVE:   Dilation: 1.5 Effacement (%): 60 Station: -2, -3 Exam by:: rebecca zhang,rnc-ob  Labs: Lab Results  Component Value Date   WBC 14.5 (H) 03/30/2024   HGB 14.8 03/30/2024   HCT 42.6 03/30/2024   MCV 94.5 03/30/2024   PLT 227 03/30/2024    Assessment / Plan: Induction of labor due to non-reassuring fetal testing,  progressing well on pitocin  Labor: Progressing on Pitocin, will continue to increase then AROM Preeclampsia:  no signs or symptoms of toxicity and labs stable Fetal Wellbeing:  Category I Pain Control:  Labor support without medications and anticipating CLEA I/D:  n/a Anticipated MOD:  NSVD  [name], MD 03/31/2024, 7:35 PM

## 2024-03-31 NOTE — Anesthesia Preprocedure Evaluation (Signed)
Anesthesia Evaluation  Patient identified by MRN, date of birth, ID band Patient awake    Reviewed: Allergy & Precautions, H&P , NPO status , Patient's Chart, lab work & pertinent test results  Airway Mallampati: II  TM Distance: >3 FB Neck ROM: Full    Dental no notable dental hx.    Pulmonary neg pulmonary ROS   Pulmonary exam normal breath sounds clear to auscultation       Cardiovascular negative cardio ROS Normal cardiovascular exam Rhythm:Regular Rate:Normal     Neuro/Psych negative neurological ROS  negative psych ROS   GI/Hepatic negative GI ROS, Neg liver ROS,,,  Endo/Other  negative endocrine ROS    Renal/GU negative Renal ROS  negative genitourinary   Musculoskeletal negative musculoskeletal ROS (+)    Abdominal  (+) + obese  Peds negative pediatric ROS (+)  Hematology negative hematology ROS (+)   Anesthesia Other Findings   Reproductive/Obstetrics (+) Pregnancy                             Anesthesia Physical Anesthesia Plan  ASA: 3  Anesthesia Plan: Epidural   Post-op Pain Management:    Induction:   PONV Risk Score and Plan:   Airway Management Planned:   Additional Equipment:   Intra-op Plan:   Post-operative Plan:   Informed Consent:   Plan Discussed with:   Anesthesia Plan Comments:        Anesthesia Quick Evaluation  

## 2024-04-01 ENCOUNTER — Encounter (HOSPITAL_COMMUNITY): Payer: Self-pay | Admitting: Obstetrics and Gynecology

## 2024-04-01 LAB — CBC
HCT: 40.9 % (ref 36.0–46.0)
Hemoglobin: 14.1 g/dL (ref 12.0–15.0)
MCH: 32.8 pg (ref 26.0–34.0)
MCHC: 34.5 g/dL (ref 30.0–36.0)
MCV: 95.1 fL (ref 80.0–100.0)
Platelets: 194 10*3/uL (ref 150–400)
RBC: 4.3 MIL/uL (ref 3.87–5.11)
RDW: 13.7 % (ref 11.5–15.5)
WBC: 17.6 10*3/uL — ABNORMAL HIGH (ref 4.0–10.5)
nRBC: 0 % (ref 0.0–0.2)

## 2024-04-01 LAB — CBC WITH DIFFERENTIAL/PLATELET
Abs Immature Granulocytes: 0.05 10*3/uL (ref 0.00–0.07)
Basophils Absolute: 0.1 10*3/uL (ref 0.0–0.1)
Basophils Relative: 0 %
Eosinophils Absolute: 0 10*3/uL (ref 0.0–0.5)
Eosinophils Relative: 0 %
HCT: 39.6 % (ref 36.0–46.0)
Hemoglobin: 13.7 g/dL (ref 12.0–15.0)
Immature Granulocytes: 0 %
Lymphocytes Relative: 10 %
Lymphs Abs: 1.3 10*3/uL (ref 0.7–4.0)
MCH: 32.9 pg (ref 26.0–34.0)
MCHC: 34.6 g/dL (ref 30.0–36.0)
MCV: 95 fL (ref 80.0–100.0)
Monocytes Absolute: 1.1 10*3/uL — ABNORMAL HIGH (ref 0.1–1.0)
Monocytes Relative: 9 %
Neutro Abs: 9.9 10*3/uL — ABNORMAL HIGH (ref 1.7–7.7)
Neutrophils Relative %: 81 %
Platelets: 184 10*3/uL (ref 150–400)
RBC: 4.17 MIL/uL (ref 3.87–5.11)
RDW: 14 % (ref 11.5–15.5)
WBC: 12.5 10*3/uL — ABNORMAL HIGH (ref 4.0–10.5)
nRBC: 0 % (ref 0.0–0.2)

## 2024-04-01 LAB — COMPREHENSIVE METABOLIC PANEL WITH GFR
ALT: 14 U/L (ref 0–44)
AST: 22 U/L (ref 15–41)
Albumin: 2.5 g/dL — ABNORMAL LOW (ref 3.5–5.0)
Alkaline Phosphatase: 93 U/L (ref 38–126)
Anion gap: 12 (ref 5–15)
BUN: 5 mg/dL — ABNORMAL LOW (ref 6–20)
CO2: 17 mmol/L — ABNORMAL LOW (ref 22–32)
Calcium: 9.4 mg/dL (ref 8.9–10.3)
Chloride: 105 mmol/L (ref 98–111)
Creatinine, Ser: 0.76 mg/dL (ref 0.44–1.00)
GFR, Estimated: >60 mL/min (ref >60)
Glucose, Bld: 85 mg/dL (ref 70–99)
Potassium: 3.6 mmol/L (ref 3.5–5.1)
Sodium: 134 mmol/L — ABNORMAL LOW (ref 135–145)
Total Bilirubin: 1.2 mg/dL (ref 0.0–1.2)
Total Protein: 5.8 g/dL — ABNORMAL LOW (ref 6.5–8.1)

## 2024-04-01 MED ORDER — SENNOSIDES-DOCUSATE SODIUM 8.6-50 MG PO TABS
2.0000 | ORAL_TABLET | ORAL | Status: DC
Start: 2024-04-01 — End: 2024-04-02
  Administered 2024-04-01 – 2024-04-02 (×2): 2 via ORAL
  Filled 2024-04-01 (×2): qty 2

## 2024-04-01 MED ORDER — ONDANSETRON HCL 4 MG PO TABS: 4.0000 mg | ORAL_TABLET | ORAL | Status: DC | PRN

## 2024-04-01 MED ORDER — FAMOTIDINE 20 MG PO TABS
20.0000 mg | ORAL_TABLET | Freq: Two times a day (BID) | ORAL | Status: DC
  Administered 2024-04-01 – 2024-04-02 (×3): 20 mg via ORAL
  Filled 2024-04-01 (×3): qty 1

## 2024-04-01 MED ORDER — PRENATAL MULTIVITAMIN CH
1.0000 | ORAL_TABLET | Freq: Every day | ORAL | Status: DC
  Administered 2024-04-01 – 2024-04-02 (×2): 1 via ORAL
  Filled 2024-04-01 (×2): qty 1

## 2024-04-01 MED ORDER — ACETAMINOPHEN 325 MG PO TABS
650.0000 mg | ORAL_TABLET | ORAL | Status: DC | PRN
Start: 2024-04-01 — End: 2024-04-02
  Administered 2024-04-01: 650 mg via ORAL
  Filled 2024-04-01: qty 2

## 2024-04-01 MED ORDER — COCONUT OIL OIL: 1.0000 | TOPICAL_OIL | Status: DC | PRN

## 2024-04-01 MED ORDER — OXYCODONE HCL 5 MG PO TABS: 5.0000 mg | ORAL_TABLET | ORAL | Status: DC | PRN

## 2024-04-01 MED ORDER — ZOLPIDEM TARTRATE 5 MG PO TABS
5.0000 mg | ORAL_TABLET | Freq: Every evening | ORAL | Status: DC | PRN
Start: 2024-04-01 — End: 2024-04-02

## 2024-04-01 MED ORDER — WITCH HAZEL-GLYCERIN EX PADS
1.0000 | MEDICATED_PAD | CUTANEOUS | Status: DC | PRN
  Administered 2024-04-01: 1 via TOPICAL

## 2024-04-01 MED ORDER — ONDANSETRON HCL 4 MG/2ML IJ SOLN
4.0000 mg | INTRAMUSCULAR | Status: DC | PRN
Start: 2024-04-01 — End: 2024-04-02

## 2024-04-01 MED ORDER — DIBUCAINE (PERIANAL) 1 % EX OINT
1.0000 | TOPICAL_OINTMENT | CUTANEOUS | Status: DC | PRN
Start: 2024-04-01 — End: 2024-04-02

## 2024-04-01 MED ORDER — DIPHENHYDRAMINE HCL 25 MG PO CAPS: 25.0000 mg | ORAL_CAPSULE | Freq: Four times a day (QID) | ORAL | Status: DC | PRN

## 2024-04-01 MED ORDER — BENZOCAINE-MENTHOL 20-0.5 % EX AERO
1.0000 | INHALATION_SPRAY | CUTANEOUS | Status: DC | PRN
  Administered 2024-04-01: 1 via TOPICAL
  Filled 2024-04-01: qty 56

## 2024-04-01 MED ORDER — SIMETHICONE 80 MG PO CHEW
80.0000 mg | CHEWABLE_TABLET | ORAL | Status: DC | PRN
Start: 2024-04-01 — End: 2024-04-02

## 2024-04-01 MED ORDER — IBUPROFEN 600 MG PO TABS
600.0000 mg | ORAL_TABLET | Freq: Four times a day (QID) | ORAL | Status: DC
  Administered 2024-04-01 – 2024-04-02 (×5): 600 mg via ORAL
  Filled 2024-04-01 (×5): qty 1

## 2024-04-01 MED ORDER — TETANUS-DIPHTH-ACELL PERTUSSIS 5-2.5-18.5 LF-MCG/0.5 IM SUSY: 0.5000 mL | PREFILLED_SYRINGE | Freq: Once | INTRAMUSCULAR | Status: DC

## 2024-04-01 NOTE — Anesthesia Postprocedure Evaluation (Signed)
 Anesthesia Post Note  Patient: Denise Howard  Procedure(s) Performed: AN AD HOC LABOR EPIDURAL     Patient location during evaluation: Mother Baby Anesthesia Type: Epidural Level of consciousness: awake and alert Pain management: pain level controlled Vital Signs Assessment: post-procedure vital signs reviewed and stable Respiratory status: spontaneous breathing, nonlabored ventilation and respiratory function stable Cardiovascular status: stable Postop Assessment: no headache, no backache and epidural receding Anesthetic complications: no   No notable events documented.  Last Vitals:  Vitals:   04/01/24 1604 04/01/24 1949  BP: 106/72 117/76  Pulse: (!) 108 97  Resp: 18 18  Temp: 36.6 C 36.6 C  SpO2: 98% 99%    Last Pain:  Vitals:   04/01/24 1949  TempSrc: Oral  PainSc: 2    Pain Goal:                Epidural/Spinal Function Cutaneous sensation: Normal sensation (04/01/24 1949), Patient able to flex knees: Yes (04/01/24 1949), Patient able to lift hips off bed: Yes (04/01/24 1949), Back pain beyond tenderness at insertion site: No (04/01/24 1949), Progressively worsening motor and/or sensory loss: No (04/01/24 1949), Bowel and/or bladder incontinence post epidural: No (04/01/24 1949)  Ritchie Cheshire

## 2024-04-01 NOTE — Lactation Note (Signed)
 This note was copied from a baby's chart. Lactation Consultation Note  Patient Name: Denise Howard [id number] Date: 04/01/2024 Age:29 hours Reason for consult: Initial assessment;1st time breastfeeding;Term.  [name] feels breastfeeding is going well. [name] sitting in chair and would like latch assistance, LC gave pillow support, [name] latched infant on her right breast using the cross cradle hold, infant latched with depth and was still breastfeeding after 14 minutes when LC left the room. [name] shown hand expression with breast model and taught back. LC discussed hunger cues, [name] knows if infant is still curing after latching on the first breast to offer the 2nd breast during the same feeding. LC discussed signs of satiety in infant. [name] will continue to breastfeed infant by cues, on demand, 8+ times within 24 hours, skin to skin. [name] knows to ask for further latch assistance if needed. LC discussed infant's input and output. The importance of maternal rest, meals and snacks.[name] was made aware of O/P services, breastfeeding support groups, community resources, and our phone # for post-discharge questions.    Maternal Data Has patient been taught Hand Expression?: Yes  Feeding Mother's Current Feeding Choice: Breast Milk  LATCH Score Latch: Grasps breast easily, tongue down, lips flanged, rhythmical sucking.  Audible Swallowing: A few with stimulation  Type of Nipple: Everted at rest and after stimulation  Comfort (Breast/Nipple): Soft / non-tender  Hold (Positioning): Assistance needed to correctly position infant at breast and maintain latch.  LATCH Score: 8   Lactation Tools Discussed/Used    Interventions Interventions: Breast feeding basics reviewed;Assisted with latch;Skin to skin;Breast compression;Adjust position;Support pillows;Position options;Hand express;Education;Guidelines for Milk Supply and Pumping Schedule Handout;LC Services brochure;CDC milk storage guidelines;CDC  Guidelines for Breast Pump Cleaning  Discharge Pump: DEBP;Personal ([name] has Spectra  DEBP at home.)  Consult Status Consult Status: Follow-up Date: 04/02/24 Follow-up type: In-patient    Pecolia Bourbon 04/01/2024, 5:00 PM

## 2024-04-01 NOTE — Progress Notes (Signed)
 Labor Progress Note  Patient due for check, overnight SROM and pit titration to 20 mU/min.  Cervix with significant change to complete , +1/+2  Will begin pushing. FHT cat 1  Isidor Marek MD

## 2024-04-02 LAB — CBC
HCT: 35.6 % — ABNORMAL LOW (ref 36.0–46.0)
Hemoglobin: 12.1 g/dL (ref 12.0–15.0)
MCH: 32.5 pg (ref 26.0–34.0)
MCHC: 34 g/dL (ref 30.0–36.0)
MCV: 95.7 fL (ref 80.0–100.0)
Platelets: 162 10*3/uL (ref 150–400)
RBC: 3.72 MIL/uL — ABNORMAL LOW (ref 3.87–5.11)
RDW: 14.1 % (ref 11.5–15.5)
WBC: 11.8 10*3/uL — ABNORMAL HIGH (ref 4.0–10.5)
nRBC: 0 % (ref 0.0–0.2)

## 2024-04-02 LAB — BIRTH TISSUE RECOVERY COLLECTION (PLACENTA DONATION)

## 2024-04-02 NOTE — Social Work (Signed)
 [name] was referred for history of depression/anxiety.  * Referral screened out by Clinical Social Worker because none of the following criteria appear to apply:  ~ History of anxiety/depression during this pregnancy, or of post-partum depression following prior delivery. Per OB records no MH concerns noted during this pregnancy  ~ Diagnosis of anxiety and/or depression within last 3 years. Per chart review [name]'s diagnosis dates back to 28 y/o. OR * [name]'s symptoms currently being treated with medication and/or therapy. Per OB records "controlled well with SSRI" Edinburgh= 4  Please contact the Clinical Social Worker if needs arise, or by [name] request.  Haroldine Likens, LCSWA Clinical Social Worker [phone]

## 2024-04-02 NOTE — Discharge Summary (Signed)
 Postpartum Discharge Summary  Date of Service updated 04/02/24     Patient Name: Denise Howard DOB: [birth year] MRN: [id number]  Date of admission: 03/30/2024 Delivery date:04/01/2024 Delivering provider: Jeannett Million A Date of discharge: 04/02/2024  Admitting diagnosis: Post-dates pregnancy [O48.0] Intrauterine pregnancy: [redacted]w[redacted]d     Secondary diagnosis:  Principal Problem:   Post-dates pregnancy  Additional problems: None    Discharge diagnosis: Term Pregnancy Delivered                                              Post partum procedures:None Augmentation: Pitocin and Cytotec Complications: None  Hospital course: Induction of Labor With Vaginal Delivery   28 y.o. yo G1P1001 at [redacted]w[redacted]d was admitted to the hospital 03/30/2024 for induction of labor.  Indication for induction: BPP 6/10.  Patient had an labor course complicated by nrFHT Membrane Rupture Time/Date: 10:25 PM,03/31/2024  Delivery Method:Vaginal, Vacuum (Extractor) Operative Delivery:Device used:Kiwi Indication: Fetal indications Episiotomy: None Lacerations:  2nd degree Details of delivery can be found in separate delivery note.  Patient had a postpartum course complicated by none. Patient is discharged home 04/02/24.  Newborn Data: Birth date:04/01/2024 Birth time:9:00 AM Gender:Female Living status:Living Apgars:6 ,9  Weight:4600 g  Magnesium Sulfate received: No BMZ received: No Rhophylac:N/A MMR:N/A T-DaP:Given prenatally Flu: N/A  There is no immunization history for the selected administration types on file for this patient.  Physical exam  Vitals:   04/01/24 1604 04/01/24 1949 04/01/24 2307 04/02/24 0505  BP: 106/72 117/76 123/84 112/76  Pulse: (!) 108 97 98 85  Resp: 18 18 18 18   Temp: 97.9 F (36.6 C) 97.9 F (36.6 C) 97.9 F (36.6 C) 98 F (36.7 C)  TempSrc: Oral Oral Oral Oral  SpO2: 98% 99% 99% 99%  Weight:      Height:       General: alert, cooperative, and no distress Lochia:  appropriate Uterine Fundus: firm Incision: N/A DVT Evaluation: No evidence of DVT seen on physical exam. Labs: Lab Results  Component Value Date   WBC 11.8 (H) 04/02/2024   HGB 12.1 04/02/2024   HCT 35.6 (L) 04/02/2024   MCV 95.7 04/02/2024   PLT 162 04/02/2024      Latest Ref Rng & Units 04/01/2024    4:26 AM  CMP  Glucose 70 - 99 mg/dL 85   BUN 6 - 20 mg/dL 5   Creatinine 2.13 - 0.86 mg/dL 5.78   Sodium 469 - 629 mmol/L 134   Potassium 3.5 - 5.1 mmol/L 3.6   Chloride 98 - 111 mmol/L 105   CO2 22 - 32 mmol/L 17   Calcium 8.9 - 10.3 mg/dL 9.4   Total Protein 6.5 - 8.1 g/dL 5.8   Total Bilirubin 0.0 - 1.2 mg/dL 1.2   Alkaline Phos 38 - 126 U/L 93   AST 15 - 41 U/L 22   ALT 0 - 44 U/L 14    Edinburgh Score:    04/01/2024    3:25 PM  Edinburgh Postnatal Depression Scale Screening Tool  I have been able to laugh and see the funny side of things. 0  I have looked forward with enjoyment to things. 0  I have blamed myself unnecessarily when things went wrong. 1  I have been anxious or worried for no good reason. 1  I have felt scared or panicky for no  good reason. 1  Things have been getting on top of me. 1  I have been so unhappy that I have had difficulty sleeping. 0  I have felt sad or miserable. 0  I have been so unhappy that I have been crying. 0  The thought of harming myself has occurred to me. 0  Edinburgh Postnatal Depression Scale Total 4      After visit meds:  Allergies as of 04/02/2024   No Known Allergies      Medication List     STOP taking these medications    famotidine  20 MG tablet Commonly known as: PEPCID    ondansetron  4 MG disintegrating tablet Commonly known as: Zofran  ODT         Discharge home in stable condition Infant Feeding: Bottle and Breast Infant Disposition:home with mother Discharge instruction: per After Visit Summary and Postpartum booklet. Activity: Advance as tolerated. Pelvic rest for 6 weeks.  Diet: routine  diet Anticipated Birth Control: Unsure Postpartum Appointment:6 weeks Additional Postpartum F/U: None Future Appointments:No future appointments. Follow up Visit:      04/02/2024 Concepcion Deck, MD

## 2024-04-02 NOTE — Lactation Note (Signed)
 This note was copied from a baby's chart. Lactation Consultation Note  Patient Name: Denise Howard [id number] Date: 04/02/2024 Age:29 hours Reason for consult: Follow-up assessment;Primapara;1st time breastfeeding;Term;Infant weight loss (4 % weight loss, post circ) Per mom baby recently fed at 10 am. Per mom baby has been latching well both breast,  Dyad for D/C today. LC reviewed breast feeding D/C teaching and LC resources. LC provided shells, and a hand pump., see below.    Maternal Data Has patient been taught Hand Expression?: Yes Does the patient have breastfeeding experience prior to this delivery?: No  Feeding Mother's Current Feeding Choice: Breast Milk  LATCH Score  8-10     Lactation Tools Discussed/Used Tools: Shells;Pump;Flanges Flange Size: 21;18 Breast pump type: Manual Pump Education: Milk Storage;Setup, frequency, and cleaning Reason for Pumping: with Steps for latching as discussed if needed and prn  Interventions Interventions: Breast feeding basics reviewed;Shells;Hand pump;LC Services brochure;CDC milk storage guidelines;CDC Guidelines for Breast Pump Cleaning  Discharge Discharge Education: Engorgement and breast care;Warning signs for feeding baby Pump: Personal;DEBP;Manual WIC Program: No  Consult Status Consult Status: Complete Date: 04/02/24 Follow-up type: In-patient    [name] 04/02/2024, 12:30 PM

## 2024-04-02 NOTE — Progress Notes (Signed)
 Post Partum Day 1 Subjective: no complaints, up ad lib, voiding, and tolerating PO  Objective: Blood pressure 112/76, pulse 85, temperature 98 F (36.7 C), temperature source Oral, resp. rate 18, height 5\' 7"  (1.702 m), weight 117 kg, SpO2 99%, unknown if currently breastfeeding.  Physical Exam:  General: alert Lochia: appropriate Uterine Fundus: firm Incision: N/A DVT Evaluation: No evidence of DVT seen on physical exam.  Recent Labs    04/01/24 0951 04/02/24 0547  HGB 14.1 12.1  HCT 40.9 35.6*    Assessment/Plan: Discharge home, Breastfeeding, and Circumcision prior to discharge D/W patient female infant circumcision, risks/benefits reviewed. All questions answered.    LOS: 3 days   Denise Deck, MD 04/02/2024, 9:01 AM

## 2024-04-04 ENCOUNTER — Inpatient Hospital Stay (HOSPITAL_COMMUNITY): Admission: RE | Admit: 2024-04-04 | Payer: Self-pay | Source: Ambulatory Visit

## 2024-04-04 ENCOUNTER — Inpatient Hospital Stay (HOSPITAL_COMMUNITY): Admission: RE | Admit: 2024-04-04 | Payer: Self-pay | Source: Home / Self Care | Admitting: Obstetrics and Gynecology

## 2024-04-14 ENCOUNTER — Telehealth (HOSPITAL_COMMUNITY): Payer: Self-pay | Admitting: *Deleted

## 2024-04-14 NOTE — Telephone Encounter (Signed)
 04/14/2024  Name: Denise Howard MRN: [id number] DOB: Aug 09, 1996  Reason for Call:  Transition of Care Hospital Discharge Call  Contact Status: Patient Contact Status: Complete  Language assistant needed:          Follow-Up Questions: Do You Have Any Concerns About Your Health As You Heal From Delivery?: No Do You Have Any Concerns About Your Infants Health?: No  Edinburgh Postnatal Depression Scale:  In the Past 7 Days: I have been able to laugh and see the funny side of things.: As much as I always could I have looked forward with enjoyment to things.: As much as I ever did I have blamed myself unnecessarily when things went wrong.: No, never I have been anxious or worried for no good reason.: Yes, sometimes I have felt scared or panicky for no good reason.: No, not much Things have been getting on top of me.: No, I have been coping as well as ever I have been so unhappy that I have had difficulty sleeping.: Not at all I have felt sad or miserable.: Not very often I have been so unhappy that I have been crying.: No, never The thought of harming myself has occurred to me.: Never Edinburgh Postnatal Depression Scale Total: 4  PHQ2-9 Depression Scale:     Discharge Follow-up:    Post-discharge interventions: Reviewed Newborn Safe Sleep Practices  Signature Julien Odor, RN, 04/14/24, [phone]
